# Patient Record
Sex: Female | Born: 1985 | Race: White | Hispanic: No | Marital: Single | Smoking: Never smoker
Health system: Southern US, Community
[De-identification: ages and names within clinical notes are randomized; demographics above are authoritative.]

## PROBLEM LIST (undated history)

## (undated) DIAGNOSIS — F419 Anxiety disorder, unspecified: Secondary | ICD-10-CM

## (undated) HISTORY — PX: INNER EAR SURGERY: SHX679

## (undated) HISTORY — DX: Anxiety disorder, unspecified: F41.9

---
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
## 2003-06-25 ENCOUNTER — Encounter: Admission: RE | Admit: 2003-06-25 | Discharge: 2003-06-25 | Payer: Self-pay | Admitting: Family Medicine

## 2003-08-11 ENCOUNTER — Other Ambulatory Visit: Admission: RE | Admit: 2003-08-11 | Discharge: 2003-08-11 | Payer: Self-pay | Admitting: Family Medicine

## 2006-01-30 ENCOUNTER — Other Ambulatory Visit: Admission: RE | Admit: 2006-01-30 | Discharge: 2006-01-30 | Payer: Self-pay | Admitting: Family Medicine

## 2007-07-05 ENCOUNTER — Emergency Department (HOSPITAL_COMMUNITY): Admission: EM | Admit: 2007-07-05 | Discharge: 2007-07-05 | Payer: Self-pay | Admitting: Family Medicine

## 2010-11-25 NOTE — H&P (Signed)
[place]. [facility]  Patient:    Ashley Hughes, Ashley Hughes Visit Number: [id number] MRN: [id number]          Service Type: DSU Location: [facility] Attending Physician:  Waldon Merl Dictated by:   Keturah Barre, M.D. Admit Date:  10/31/2001   CC:         Monica Becton, M.D.   History and Physical  HISTORY OF PRESENT ILLNESS:  This patient is an 25 year old female who has had multiple ear surgeries in the past.  She has had bilateral myringotomy and tubes, has had PE tube in the right ear placed which is now extruded.  She has had apparently four tympanoplasties - the most recent one included a TORP reconstruction which has extruded as seen in my office and I removed that from the external ear canal.  She now has a quite retracted tympanic membrane. I can see the head of the stapes, the incus appears to be absent and probably was removed, and the malleus is retracted.  She has a very retracted tympanic membrane and inferiorly there is somewhat of a bulging area that may be granulation tissue, it may be cholesteatoma, it could be just an area of granulation.  First, we will do on the right ear a myringotomy and tube to relieve the fluid.  On the left ear, we will do an exploratory tympanotomy, lysis of adhesions, and removal of cholesteatoma if found, and possible tympanoplasty.  Her past audiogram has been showing a left ear 40 decibel air-bone gap, on the right a 25 decibel with a 96% and 100% discrimination score and a flat tympanogram on both ears.  PAST HISTORY:  Is quite unremarkable.  She does take a birth control pill and does take one Paxil per day, and has been on Prolix D to try to bring more decongestant to her eustachian tube.  She has used Neo-Synephrine nasal drops to no effect.  SURGICAL HISTORY:  Her only history of surgery is the tympanoplasty and the tubes in the past.  PHYSICAL EXAMINATION:  VITAL SIGNS:  Blood pressure  140/80, pulse 95, weight 165, height 5 feet 1 inch.  HEENT:  Her right ear shows a considerable retraction of the tympanic membrane with some adhesions but just fluid seen behind the tympanic membrane which appears to be quite thin.  The left ear shows severe retraction with this area of the hypotympanum which is somewhat suspicious of granulation tissue or cholesteatoma.  We can see the epitympanum reasonably well which is quite retracted.  I can see the head of the stapes.  The retraction is down to the facial nerve region and to the promontory region, but then this suspicious bulge is inferiorly.  She has been on antibiotics and decongestants in the past on several occasions, making little progress.  The audiogram is as mentioned.  CHEST:  Clear, no rales, rhonchi, or wheezes.  CARDIOVASCULAR:  No opening snaps, murmurs, or gallops.  ABDOMEN:  Free of any organomegaly, tenderness, or mass.  EXTREMITIES:  Unremarkable.  NECK:  Free of any thyromegaly, cervical adenopathy, or mass.  NEUROLOGIC:  Her cranial nerves are intact - true cords, false cords, the shoulder strength, facial, EOMs are all within normal limits.  INITIAL DIAGNOSIS:  Status post serous otitis otitis media bilateral, history of PE tube right, history of tympanoplasty with TORP left, history of previous tympanoplasties.  PLAN:  Our plan is to do a right myringotomy and tube and a left exploratory tympanotomy  and lysis of adhesions and removal of cholesteatoma and/or tympanoplasty as necessary. Dictated by:   Keturah Barre, M.D. Attending Physician:  Waldon Merl DD:  10/31/01 TD:  10/31/01 Job: [id number]

## 2010-11-25 NOTE — Op Note (Signed)
[place]. [facility]  Patient:    Ashley Hughes, Ashley Hughes                MRN: [id number] Proc. Date: 07/05/00 Adm. Date:  [id number] Attending:  Claudina Lick                           Operative Report  PREOPERATIVE DIAGNOSES: 1. 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 2. Chronic right serous otitis.  POSTOPERATIVE DIAGNOSES: 1. 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 2. Chronic right serous otitis.  PROCEDURES: 1. Second stage left tympanoplasty with TORP. 2. Right tube myringotomy.  SURGEON: Dr. Garrison Columbus  ANESTHESIA:  General.  PROCEDURE:  This patient 6 months prior had undergone a left tympanomastoidectomy with removal of a middle ear and mastoid cholesteatoma and she is admitted at this time for the second stage procedure and ossicular reconstruction if possible.  The patient has also developed a chronic right serous otitis and has a bilateral conductive loss and is planned for a right tube myringotomy as well.  DESCRIPTION OF PROCEDURE:  After satisfactory general endotracheal anesthesia had been induced 2% Xylocaine containing 1:200,000 epinephrine was infiltrated in the left external ear canal and post auricularly for hemostasis after which the ear and surrounding area were prepped with Betadine and sterile drapes applied.  A large posterior canal skin flap was outlined with he scalpel, and the skin then elevated from the canal wall down to the fibrous annulus which was lifted from the bony sulcus. The posterior half of the ear drum was quite retracted and this was carefully dissected out from over the incus which had been placed in the posterior superior quadrant.  It was also adherent to the medial wall in the posterior inferior quadrant but there was  good air containing space anterior to that and a previously placed reinforced Silastic sheeting was removed.  The incus was dissected up from over the stapes area.  There was no evidence of middle ear or attic cholesteatoma.  There was some scarring in the attic that was dissected away. The incus was placed in the attic, and I could not fully visualize the stapedial area and the anterior crux of the stapes was absent.  The remainder of the superstructure was displaced inferiorly adherent to the prominent tori.  The foot plate was intact and mobile and there was still a partial attachment of the posterior crux.  This was simply left intact.  Thin nonreinforced middle ear Silastic sheeting was placed over the medial wall middle ear space and passed under the handle of the malleolus.  A small incision was made over the posterior external ear and a small piece of perichondrium as well as a small button of ear cartilage was taken and placed in saline.  The perichondrium was placed aside to dry and then an additional piece of cartilage was taken and after closing the incision with interrupted 5-0 chromic gut, the second piece of cartilage was placed in the attic region to seal that off.  A xomed hydroxy appetite TORP was then selected.  The shaft shortened by approximately 1/3rd. The shaft was then centered in the oval window and supportED with several small pledgets of Gelfoam saturated with Cortisporin suspension.  The button of cartilage was slightly crushed and placed over the  head of the prosthesis, and the perichondrial graft was placed over this and tucked under the margin of the drum inferiorly and under the handle of the malleus.  The squamous epithelium which had been dissected up from the posterior drum head area was then placed over the prosthesis and the canal skin was then replaced in near anatomic position.  Pledgets of Gelfoam saturatED with Cortisporin suspension were placed  over the graft and canal skin flap and the remainder of the canal filled with an oto wick saturated with Cortisporin suspension.  Examination of the right ear under the microscope showed a very tenuous connection between the head of the stapes and the long process of the incus and the ear drum was exceedingly thin and retracted and there was clear middle ear fluid.  A small incision was made just inferior to the umbo.  Some seromucoid fluid removed with a suction and a Donaldson silastic collar button tube was inserted through the opening and supported with a small pledget of Gelfoam saturated with Cortisporin suspension.  The patient tolerated the procedure well.  ESTIMATED BLOOD LOSS:  Less than 5 cc.  DISPOSITION: She was awakened from the anesthesia and taken to the recovery room in satisfactory condition. DD:  07/05/00 TD:  07/05/00 Job: [id number]

## 2010-11-25 NOTE — Op Note (Signed)
[place]. [facility]  Patient:    Ashley Hughes                 MRN: [id number] Proc. Date: 07/25/99 Adm. Date:  [id number] Attending:  Claudina Lick                           Operative Report  PREOPERATIVE DIAGNOSIS:  Chronic left otitis media with atelectasis posterior ear drum.  POSTOPERATIVE DIAGNOSIS:  Attical and mastoid middle ear cholesteatoma.  OPERATION PERFORMED:  Left tympanomastoidectomy.  SURGEON:  Robert L. Lyman Bishop, M.D.  ANESTHESIA:  General.  DESCRIPTION OF PROCEDURE:  This 25 year old white female was admitted with a history of recurrent ear infections in early childhood.  She had had two myringotomies and had not been seen in a number of years until two months ago. She was seen with drainage from her left ear and had a retracted ear drum with middle ear fluid and granulation tissue over the posterior aspect of the ear drum. This was treated with silver nitrate cauterization and Cortisporin drops.  Exam audiogram showed a mild conductive loss in the left ear, but because of the granulation tissue and continued drainage, I recommended exploration of possible cholesteatoma.  After satisfactory general endotracheal anesthesia had been induced, 2% Xylocaine with 1:50,000 epinephrine was infiltrated into the external ear canal on the left and postauricularly in the left ear and surrounding area.  Prepped with Betadine and sterile drapes were applied.  The granulation tissue was gradually removed rom the surface of the ear drum posteriorly, and I could see that there was a myringostapediopexy with the posterior half of the ear drum thick and adherent o the medial wall of the middle ear space.  The anterior portion of the ear drum as very thick, and I suspected that there was a middle ear space with fluid.  So after removal of the granulation tissue, I could see that there was an attical retraction pocket  cholesteatoma; the extent of which I could not determine at this point.  Vertical canal incisions were made at 12 and 6 oclock and connected posteriorly  just medial to the bony cartilaginous junction.  The very thick posterior skin as elevated from the bony canal down to the fibrous annulus, which was carefully lifted from the bony sulcus.  The drum was closely adherent to the posterior medial wall of the middle ear space.  An anterior atticotomy was carried out with a curette and a small drill, and it became obvious that the limits of the retraction pocket could not be determined or treated from this approach.  Therefore, a postauricular incision was made.  The soft tissue was elevated from over the mastoid cortex, which was saucerized with a large, round, cutting bur until the  antrum was entered, and a very thick wall cholesteatoma sac was found extending  into the mastoid.  This mastoid was small, relatively acellular, and a complete  mastoidectomy was carried out.  Then an anterior atticotomy was carried forward. The body and short process of the incus was found, and the remainder of the incus was removed and placed in saline.  I found the squamous epithelium extended medial and anterior to the head of the malleus.  However, from the posterior approach, I felt like I was able to completely remove it.  I then followed the squamous epithelium from the posterior atticotomy into the middle ear space, and the posterior half  of the ear drum was carefully dissected up from the medial wall.  The handle of the malleus was quite retracted and where the end of the eardrum anteriorly lead back to the medial wall of the middle ear space, this was incised creating almost a total defect of the eardrum.  The middle ear space was lined ith very thickened mucosa with thick mucoid exudate.  This was present inferiorly as well.  A large piece of temporalis fascia had been taken for use as a  graft and  placed aside to dry.  The cholesteatoma sac was completely removed from the mastoid attic, and the remaining mastoid cavity was smoothed with a diamond bur.  A circular piece of thin, reinforced, Silastic sheeting was placed over the medial wall of the middle ear space and allowed to extend around the stapes.  The stapes was fully encased in very thick, fibrous, inflammatory tissue.  The integrity of the stapes, I could not determine at this point in time.  The middle ear cleft as then packed with pledgets of Gelfoam and saturated with Cortisporin suspension, and the temporalis fascia graft was trimmed to appropriate size and shape and placed over the defect, tucked under the margins of the drum anteriorly and inferiorly, and extended under the handle of the malleus.  The incus, which had been cleaned, was replaced and placed in the posterior, superior, ______  of the middle ear space, placed over the head of the stapes, and then tucked under the neck of the malleus.  I did not plan at this time to do a complete incus rotation.  The graft was allowed to extend over this and slightly onto the posterior superior canal wall, and the canal skin flaps were replaced in anatomic position over the graft. The external canal was then packed with Gelfoam saturated with Cortisporin suspension with the lateral part of the canal filled with ______.  The postauricular incision was closed in layers with 3-0 and 4-0 chromic gut with running 5-0 nylon to the skin.  Estimated blood loss was 10-15 cc.  The patient  tolerated the procedure well and was awakened from anesthesia and taken to the recovery room in satisfactory condition.  It is anticipated that a second look r second-stage procedure will be necessary in six months. DD:  07/25/99 TD:  07/25/99 Job: [id number]

## 2010-11-25 NOTE — Op Note (Signed)
[place]. [facility]  Patient:    Ashley Hughes, Ashley Hughes Visit Number: [id number] MRN: [id number]          Service Type: DSU Location: [facility] Attending Physician:  Waldon Merl Dictated by:   Keturah Barre, M.D. Proc. Date: 10/31/01 Admit Date:  10/31/2001   CC:         Vernon Prey, M.D., [place], [place]   Operative Report  PREOPERATIVE DIAGNOSIS:  Right serous otitis with retraction of the tympanic membrane.  The left ear severe retraction of tympanic membrane with hypotympanic fullness, possible cholesteatoma, possible granulation tissue with severe retraction and poor eustachian tube function.  OPERATIONS:  Right myringotomy and tube, left exploratory tympanotomy, lysis of adhesive, removal of hypotympanic cholesteatoma, and mobilization of the ossicular or checking of the ossicular chain, which is mobile.  OPERATOR:  Keturah Barre, M.D.  ANESTHESIA:  General endotracheal.  DESCRIPTION OF PROCEDURE:  The patient was placed in the supine position. Under general endotracheal anesthesia, the right ear was first approached and a myringotomy was carried out in the anterior/inferior aspect of the tympanic membrane.  The tympanic membrane was found to be thin, fluid was suctioned, a type I Paparella PE tube was placed, followed by Pediotic drops.  Cotton was placed in the external ear canal.  The head was then turned and the left ear was approached.  This was prepped and draped in the usual manner using Betadine and the head drape, and then the scope was again brought in after rescrubbing, draping, and gowning and gloving.  The four quadrants of the external canal were injected with 1% Xylocaine with epinephrine.  All debris was removed from the external ear canal.  The tympanotomy incision was carried out and we extended down anteriorly was so retracted that we decided to approach this more from a hypotympanic way and we worked our way down to  the annulus and then into the middle ear.  In fact, with this bulging area, we in fact did find cholesteatoma.  We were able to remove considerable amount of cholesteatoma that was taking up this hypotympanum and we removed a considerable amount of debris just with suctioning and then using the cup forceps to the right, left, upbiting and very carefully removing these, and then the whirly bird was used to explore areas of hypotympanum that we could not see directly and further cholesteatoma was removed.  All of the cholesteatoma was removed.  The entire hypotympanum was found to be in quite decent condition after this was removed and the membrane appeared to be reasonable and I think, essentially, the eustachian tube has a chance to function quite reasonably.  The problem superiorly is that there was compartmentalization and adhesions and these were lysed.  The drum, which was somewhat thin, but not in a condition to try to repair it was retracted laterally and now with the cholesteatoma gone, I think the best way to approach this is to let the eustachian tube do what it might very well be able to do considering this girl is now 25 years old.  If we could get it to ventilate it well, then the retraction would no longer be a problem and we may come back later and do a further lateralization of the epitympanum region to improve hearing.  I think actually, the hearing will be improved just with this removal of cholesteatoma and correction of the adhesions around the inferior portion of the stapes.  We did not correct all  of the adhesions up superior to the stapes because it was difficult to see around the facial nerve and the facial nerve region.  Once this was achieved, then the tympanic membrane, which was still intact, was replaced in its original position and Gelfoam was placed within the middle ear.  Neosporin was placed in the external ear canal and the patient was awakened, tolerated  the procedure well, and was doing well postoperatively.  FOLLOW-UP:  One week, and then in two weeks, three weeks, six weeks, eight weeks and then three months, six months, and a year. Dictated by:   Keturah Barre, M.D. Attending Physician:  Waldon Merl DD:  10/31/01 TD:  10/31/01 Job: [id number]

## 2016-08-18 ENCOUNTER — Ambulatory Visit (INDEPENDENT_AMBULATORY_CARE_PROVIDER_SITE_OTHER): Payer: BLUE CROSS/BLUE SHIELD | Admitting: Physician Assistant

## 2016-08-18 ENCOUNTER — Encounter: Payer: Self-pay | Admitting: Physician Assistant

## 2016-08-18 ENCOUNTER — Ambulatory Visit: Payer: Self-pay | Admitting: Physician Assistant

## 2016-08-18 VITALS — BP 105/60 | HR 71 | Temp 97.3°F | Ht 60.0 in | Wt 239.0 lb

## 2016-08-18 DIAGNOSIS — N912 Amenorrhea, unspecified: Secondary | ICD-10-CM | POA: Diagnosis not present

## 2016-08-18 DIAGNOSIS — R110 Nausea: Secondary | ICD-10-CM | POA: Diagnosis not present

## 2016-08-18 NOTE — Patient Instructions (Signed)

## 2016-08-19 LAB — BETA HCG QUANT (REF LAB): hCG Quant: <1 m[IU]/mL

## 2016-08-21 ENCOUNTER — Encounter: Payer: Self-pay | Admitting: Physician Assistant

## 2016-08-21 ENCOUNTER — Other Ambulatory Visit: Payer: Self-pay | Admitting: *Deleted

## 2016-08-21 DIAGNOSIS — N926 Irregular menstruation, unspecified: Secondary | ICD-10-CM

## 2016-08-21 NOTE — Addendum Note (Signed)
Addended by: Tamera PuntWRAY, Marijose Curington S on: 08/21/2016 10:19 AM   Modules accepted: Orders

## 2016-08-21 NOTE — Progress Notes (Signed)
BP 105/60   Pulse 71   Temp 97.3 F (36.3 C) (Oral)   Ht 5' (1.524 m)   Wt 239 lb (108.4 kg)   BMI 46.68 kg/m    Subjective:    Patient ID: Ashley Hughes, female    DOB: 10/02/1985, 31 y.o.   MRN: [id number]  Ashley Hughes is a 31 y.o. female presenting on 08/18/2016 for Establish Care (Thinks she may be pregnant. Has a mirena)  HPI this is a new patient to our office. She has been living in [place] for the past 2 years with her husband. She had lived in the area previously. She has just moved back. She has had a Mirena in place for about 2 years now. He was placed while living in [place]. To begin with she had rare spotting and essentially no cycles. She still is not having a cycle. She just feels very tired, some nausea. She is concerned about being pregnant. We have discussed referral to a gynecologist if her test is positive. She had discussed wanting to go to [facility] in [place]. 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 Her history is positive for 2 pregnancies, one voluntary abortion without complications, one pregnancy without complications.  No past medical history on file. Relevant past medical, surgical, family and social history reviewed and updated as indicated. Interim medical history since our last visit reviewed. Allergies and medications reviewed and updated.   Data reviewed from any sources in EPIC.  Review of Systems  Constitutional: Positive for fatigue. Negative for activity change and fever.  HENT: Negative.   Eyes: Negative.   Respiratory: Negative.  Negative for cough.   Cardiovascular: Negative.  Negative for chest pain.  Gastrointestinal: Negative.  Negative for abdominal pain.  Endocrine: Negative.   Genitourinary: Negative.  Negative for dysuria.  Musculoskeletal: Negative.   Skin: Negative.   Neurological: Negative.      Social History   Social History  . Marital status: Single    Spouse name: N/A  . Number of  children: N/A  . Years of education: N/A   Occupational History  . Not on file.   Social History Main Topics  . Smoking status: Never Smoker  . Smokeless tobacco: Never Used  . Alcohol use Not on file  . Drug use: Unknown  . Sexual activity: Not on file   Other Topics Concern  . Not on file   Social History Narrative  . No narrative on file    Past Surgical History:  Procedure Laterality Date  . CESAREAN SECTION  2015  . INNER EAR SURGERY      Family History  Problem Relation Age of Onset  . Diabetes Mother     Allergies as of 08/18/2016   No Known Allergies     Medication List       Accurate as of 08/18/16 11:59 PM. Always use your most recent med list.          DULoxetine 60 MG capsule Commonly known as:  CYMBALTA Take 60 mg by mouth daily.   levonorgestrel 20 MCG/24HR IUD Commonly known as:  MIRENA 1 each by Intrauterine route once.          Objective:    BP 105/60   Pulse 71   Temp 97.3 F (36.3 C) (Oral)   Ht 5' (1.524 m)   Wt 239 lb (108.4 kg)   BMI 46.68 kg/m   No Known Allergies Wt Readings from Last  3 Encounters:  08/18/16 239 lb (108.4 kg)    Physical Exam  Constitutional: She is oriented to person, place, and time. She appears well-developed and well-nourished.  HENT:  Head: Normocephalic and atraumatic.  Right Ear: Tympanic membrane, external ear and ear canal normal.  Left Ear: Tympanic membrane, external ear and ear canal normal.  Nose: Nose normal. No rhinorrhea.  Mouth/Throat: Oropharynx is clear and moist and mucous membranes are normal. No oropharyngeal exudate or posterior oropharyngeal erythema.  Eyes: Conjunctivae and EOM are normal. Pupils are equal, round, and reactive to light.  Neck: Normal range of motion. Neck supple.  Cardiovascular: Normal rate, regular rhythm, normal heart sounds and intact distal pulses.   Pulmonary/Chest: Effort normal and breath sounds normal.  Abdominal: Soft. Bowel sounds are normal.    Neurological: She is alert and oriented to person, place, and time. She has normal reflexes.  Skin: Skin is warm and dry. No rash noted.  Psychiatric: She has a normal mood and affect. Her behavior is normal. Judgment and thought content normal.    Results for orders placed or performed in visit on 08/18/16  Beta HCG, Quant  Result Value Ref Range   hCG Quant <1 mIU/mL      Assessment & Plan:   1. Amenorrhea GYN referral if positive pregnancy - Beta HCG, Quant  2. Nausea GYN referral if positive pregnancy - Beta HCG, Quant   Continue all other maintenance medications as listed above. Educational handout given for prenatal care  Follow up plan: Return if symptoms worsen or fail to improve.  Remus Loffler PA-C [facility] [address]  [place], [place] [address] [phone]   08/21/2016, 8:23 AM

## 2016-08-24 ENCOUNTER — Encounter: Payer: Self-pay | Admitting: Adult Health

## 2016-08-24 ENCOUNTER — Ambulatory Visit (INDEPENDENT_AMBULATORY_CARE_PROVIDER_SITE_OTHER): Payer: BLUE CROSS/BLUE SHIELD | Admitting: Adult Health

## 2016-08-24 VITALS — BP 130/60 | HR 86 | Ht 60.75 in | Wt 242.0 lb

## 2016-08-24 DIAGNOSIS — Z3202 Encounter for pregnancy test, result negative: Secondary | ICD-10-CM

## 2016-08-24 DIAGNOSIS — Z975 Presence of (intrauterine) contraceptive device: Secondary | ICD-10-CM

## 2016-08-24 DIAGNOSIS — R109 Unspecified abdominal pain: Secondary | ICD-10-CM | POA: Insufficient documentation

## 2016-08-24 DIAGNOSIS — L900 Lichen sclerosus et atrophicus: Secondary | ICD-10-CM | POA: Diagnosis not present

## 2016-08-24 DIAGNOSIS — T8332XA Displacement of intrauterine contraceptive device, initial encounter: Secondary | ICD-10-CM

## 2016-08-24 LAB — POCT URINALYSIS DIPSTICK
Glucose, UA: NEGATIVE
Ketones, UA: NEGATIVE
Leukocytes, UA: NEGATIVE
Nitrite, UA: NEGATIVE
Protein, UA: NEGATIVE

## 2016-08-24 LAB — POCT URINE PREGNANCY: Preg Test, Ur: NEGATIVE

## 2016-08-24 MED ORDER — CLOBETASOL PROPIONATE 0.05 % EX CREA: 1.0000 "application " | TOPICAL_CREAM | Freq: Two times a day (BID) | CUTANEOUS | 1 refills | Status: DC

## 2016-08-24 NOTE — Progress Notes (Signed)
Subjective:     Patient ID: Ashley Hughes, female   DOB: 07/25/1985, 31 y.o.   MRN: [id number]  HPI Ashley Hughes is a 31 year old white female, married in as new pt., complaining of stomach cramps and pressure and feels movement.Has IUD for 2.5 years was placed in [place] and does not feel strings.Has had negative blood and urine pregnancy tests.  PCP is [facility].   Review of Systems +stomach cramps and pressure Feels movement Reviewed past medical,surgical, social and family history. Reviewed medications and allergies.     Objective:   Physical Exam BP 130/60 (BP Location: Left Arm, Patient Position: Sitting, Cuff Size: Large)   Pulse 86   Ht 5' 0.75" (1.543 m)   Wt 242 lb (109.8 kg)   BMI 46.10 kg/m UPT negative.Urine dipstick trace blood. PHQ 2 score 1. Skin warm and dry.Pelvic: external genitalia is normal in appearance but has thick thickened skin left inner labia, vagina: pink with good moisture and rugae,urethra has no lesions or masses noted, cervix:smooth, NO IUD strings seen, uterus: normal size, shape and contour, non tender, no masses felt, adnexa: no masses or tenderness noted. Bladder is non tender and no masses felt. Discussed lichen sclerosus with her and showed pictures and that it is chronic, and may need biopsy if does not respond to temovate.  Will get US tomorrow to assess uterus and IUD.  Face time 20 minutes with 50% counseling and coordinating care.     Assessment:     1. Stomach cramps   2. Intrauterine contraceptive device threads lost, initial encounter   3. Pregnancy examination or test, negative result   4. Lichen sclerosus       Plan:     Return in 1 day for US to assess IUD and stomach cramps Rx temovate 0.05% cream use bid for 2 weeks then 2-3 x weekly to affected area Review handout on lichen sclerosus Follow up with me in 4 weeks

## 2016-08-24 NOTE — Patient Instructions (Signed)
Lichen Sclerosus Introduction Lichen sclerosus is a skin problem. It can happen on any part of the body, but it commonly involves the anal or genital areas. It can cause itching and discomfort in these areas. Treatment can help to control symptoms. When the genital area is affected, getting treatment is important because the condition can cause scarring that may lead to other problems. What are the causes? The cause of this condition is not known. It could be the result of an overactive immune system or a lack of certain hormones. Lichen sclerosus is not an infection or a fungus. It is not passed from one person to another (not contagious). What increases the risk? This condition is more likely to develop in women, usually after menopause. What are the signs or symptoms? Symptoms of this condition include:  Thin, wrinkled, white areas on the skin.  Thickened white areas on the skin.  Red and swollen patches (lesions) on the skin.  Tears or cracks in the skin.  Bruising.  Blood blisters.  Severe itching. You may also have pain, itching, or burning with urination. Constipation is also common in people with lichen sclerosus. How is this diagnosed? This condition may be diagnosed with a physical exam. In some cases, a tissue sample (biopsy sample) may be removed to be looked at under a microscope. How is this treated? This condition is usually treated with medicated creams or ointments (topical steroids) that are applied over the affected areas. Follow these instructions at home:  Take over-the-counter and prescription medicines only as told by your health care provider.  Use creams or ointments as told by your health care provider.  Do not scratch the affected areas of skin.  Women should keep the vaginal area as clean and dry as possible.  Keep all follow-up visits as told by your health care provider. This is important. Contact a health care provider if:  You have increasing  redness, swelling, or pain in the affected area.  You have fluid, blood, or pus coming from the affected area.  You have new lesions on your skin.  You have a fever.  You have pain during sex. This information is not intended to replace advice given to you by your health care provider. Make sure you discuss any questions you have with your health care provider. Document Released: 11/16/2010 Document Revised: 12/02/2015 Document Reviewed: 09/21/2014  2017 Elsevier  

## 2016-08-25 ENCOUNTER — Ambulatory Visit (INDEPENDENT_AMBULATORY_CARE_PROVIDER_SITE_OTHER): Payer: BLUE CROSS/BLUE SHIELD

## 2016-08-25 ENCOUNTER — Telehealth: Payer: Self-pay | Admitting: Adult Health

## 2016-08-25 ENCOUNTER — Other Ambulatory Visit: Payer: Self-pay | Admitting: Adult Health

## 2016-08-25 DIAGNOSIS — R109 Unspecified abdominal pain: Secondary | ICD-10-CM

## 2016-08-25 DIAGNOSIS — N854 Malposition of uterus: Secondary | ICD-10-CM

## 2016-08-25 DIAGNOSIS — Z975 Presence of (intrauterine) contraceptive device: Secondary | ICD-10-CM | POA: Diagnosis not present

## 2016-08-25 DIAGNOSIS — N83292 Other ovarian cyst, left side: Secondary | ICD-10-CM

## 2016-08-25 DIAGNOSIS — T8332XA Displacement of intrauterine contraceptive device, initial encounter: Secondary | ICD-10-CM

## 2016-08-25 NOTE — Progress Notes (Signed)
PELVIC US TA/TV: homogeneous anteverted uterus,wnl,IUD is centrally located with in the endometrium,EEC 9.6 mm,simple left ovarian cyst 2.5 x 2.4 x 2.2 cm,normal right ovary,no free fluid,no pain during ultrasound,ovaries appear mobile

## 2016-08-25 NOTE — Telephone Encounter (Signed)
Pt aware IUD in place, in good position and has simple cyst left ovary, increase fluids and use tylenol or advil

## 2016-09-21 ENCOUNTER — Ambulatory Visit: Payer: BLUE CROSS/BLUE SHIELD | Admitting: Adult Health

## 2016-09-26 ENCOUNTER — Ambulatory Visit: Payer: BLUE CROSS/BLUE SHIELD | Admitting: Adult Health

## 2016-10-04 ENCOUNTER — Ambulatory Visit: Payer: BLUE CROSS/BLUE SHIELD | Admitting: Adult Health

## 2016-10-24 ENCOUNTER — Ambulatory Visit (INDEPENDENT_AMBULATORY_CARE_PROVIDER_SITE_OTHER): Payer: BLUE CROSS/BLUE SHIELD | Admitting: *Deleted

## 2016-10-24 DIAGNOSIS — Z23 Encounter for immunization: Secondary | ICD-10-CM

## 2016-10-24 DIAGNOSIS — Z111 Encounter for screening for respiratory tuberculosis: Secondary | ICD-10-CM | POA: Diagnosis not present

## 2016-10-24 NOTE — Progress Notes (Signed)
PPD placed R forearm Pt tolerated well 

## 2016-10-26 LAB — TB SKIN TEST
Induration: 0 mm
TB Skin Test: NEGATIVE

## 2016-11-08 ENCOUNTER — Encounter: Payer: Self-pay | Admitting: Physician Assistant

## 2016-11-08 ENCOUNTER — Ambulatory Visit (INDEPENDENT_AMBULATORY_CARE_PROVIDER_SITE_OTHER): Payer: BLUE CROSS/BLUE SHIELD | Admitting: Physician Assistant

## 2016-11-08 VITALS — BP 112/66 | HR 66 | Temp 98.1°F | Ht 60.75 in | Wt 237.8 lb

## 2016-11-08 DIAGNOSIS — J301 Allergic rhinitis due to pollen: Secondary | ICD-10-CM

## 2016-11-08 MED ORDER — LORATADINE 10 MG PO TABS: 10.0000 mg | ORAL_TABLET | Freq: Every day | ORAL | 11 refills | Status: DC

## 2016-11-08 MED ORDER — FLUTICASONE PROPIONATE 50 MCG/ACT NA SUSP: 2.0000 | Freq: Every day | NASAL | 6 refills | Status: DC

## 2016-11-08 MED ORDER — TRAZODONE HCL 50 MG PO TABS: 50.0000 mg | ORAL_TABLET | Freq: Every evening | ORAL | 6 refills | Status: DC | PRN

## 2016-11-08 NOTE — Patient Instructions (Signed)
Insomnia Insomnia Insomnia is a sleep disorder that makes it difficult to fall asleep or to stay asleep. Insomnia can cause tiredness (fatigue), low energy, difficulty concentrating, mood swings, and poor performance at work or school. There are three different ways to classify insomnia:  Difficulty falling asleep.  Difficulty staying asleep.  Waking up too early in the morning. Any type of insomnia can be long-term (chronic) or short-term (acute). Both are common. Short-term insomnia usually lasts for three months or less. Chronic insomnia occurs at least three times a week for longer than three months. What are the causes? Insomnia may be caused by another condition, situation, or substance, such as:  Anxiety.  Certain medicines.  Gastroesophageal reflux disease (GERD) or other gastrointestinal conditions.  Asthma or other breathing conditions.  Restless legs syndrome, sleep apnea, or other sleep disorders.  Chronic pain.  Menopause. This may include hot flashes.  Stroke.  Abuse of alcohol, tobacco, or illegal drugs.  Depression.  Caffeine.  Neurological disorders, such as Alzheimer disease.  An overactive thyroid (hyperthyroidism). The cause of insomnia may not be known. What increases the risk? Risk factors for insomnia include:  Gender. Women are more commonly affected than men.  Age. Insomnia is more common as you get older.  Stress. This may involve your professional or personal life.  Income. Insomnia is more common in people with lower income.  Lack of exercise.  Irregular work schedule or night shifts.  Traveling between different time zones. What are the signs or symptoms? If you have insomnia, trouble falling asleep or trouble staying asleep is the main symptom. This may lead to other symptoms, such as:  Feeling fatigued.  Feeling nervous about going to sleep.  Not feeling rested in the morning.  Having trouble concentrating.  Feeling  irritable, anxious, or depressed. How is this treated? Treatment for insomnia depends on the cause. If your insomnia is caused by an underlying condition, treatment will focus on addressing the condition. Treatment may also include:  Medicines to help you sleep.  Counseling or therapy.  Lifestyle adjustments. Follow these instructions at home:  Take medicines only as directed by your health care provider.  Keep regular sleeping and waking hours. Avoid naps.  Keep a sleep diary to help you and your health care provider figure out what could be causing your insomnia. Include:  When you sleep.  When you wake up during the night.  How well you sleep.  How rested you feel the next day.  Any side effects of medicines you are taking.  What you eat and drink.  Make your bedroom a comfortable place where it is easy to fall asleep:  Put up shades or special blackout curtains to block light from outside.  Use a white noise machine to block noise.  Keep the temperature cool.  Exercise regularly as directed by your health care provider. Avoid exercising right before bedtime.  Use relaxation techniques to manage stress. Ask your health care provider to suggest some techniques that may work well for you. These may include:  Breathing exercises.  Routines to release muscle tension.  Visualizing peaceful scenes.  Cut back on alcohol, caffeinated beverages, and cigarettes, especially close to bedtime. These can disrupt your sleep.  Do not overeat or eat spicy foods right before bedtime. This can lead to digestive discomfort that can make it hard for you to sleep.  Limit screen use before bedtime. This includes:  Watching TV.  Using your smartphone, tablet, and computer.  Stick to  a routine. This can help you fall asleep faster. Try to do a quiet activity, brush your teeth, and go to bed at the same time each night.  Get out of bed if you are still awake after 15 minutes of  trying to sleep. Keep the lights down, but try reading or doing a quiet activity. When you feel sleepy, go back to bed.  Make sure that you drive carefully. Avoid driving if you feel very sleepy.  Keep all follow-up appointments as directed by your health care provider. This is important. Contact a health care provider if:  You are tired throughout the day or have trouble in your daily routine due to sleepiness.  You continue to have sleep problems or your sleep problems get worse. Get help right away if:  You have serious thoughts about hurting yourself or someone else. This information is not intended to replace advice given to you by your health care provider. Make sure you discuss any questions you have with your health care provider. Document Released: 06/23/2000 Document Revised: 11/26/2015 Document Reviewed: 03/27/2014 Elsevier Interactive Patient Education  2017 [employer].

## 2016-11-10 DIAGNOSIS — J301 Allergic rhinitis due to pollen: Secondary | ICD-10-CM | POA: Insufficient documentation

## 2016-11-10 NOTE — Progress Notes (Signed)
BP 112/66   Pulse 66   Temp 98.1 F (36.7 C) (Oral)   Ht 5' 0.75" (1.543 m)   Wt 237 lb 12.8 oz (107.9 kg)   BMI 45.30 kg/m    Subjective:    Patient ID: Ashley Hughes, female    DOB: 1986-07-07, 31 y.o.   MRN: [id number]  HPI: TYLEAH LOH is a 31 y.o. female presenting on 11/08/2016 for Fatigue; sneezing; Sore Throat; Dizziness; Anxiety; and Hot Flashes  For a couple of weeks she has had increasing post nasal drip, itching and congestion. Denies fever or chills. Some watery eyes and tickly cough, especially at night.  Relevant past medical, surgical, family and social history reviewed and updated as indicated. Allergies and medications reviewed and updated.  Past Medical History:  Diagnosis Date  . Anxiety     Past Surgical History:  Procedure Laterality Date  . CESAREAN SECTION  2015  . INNER EAR SURGERY      Review of Systems  Constitutional: Negative.   HENT: Positive for congestion, postnasal drip, rhinorrhea and sneezing.   Eyes: Negative.   Respiratory: Positive for cough.   Gastrointestinal: Negative.   Genitourinary: Negative.     Allergies as of 11/08/2016   No Known Allergies     Medication List       Accurate as of 11/08/16 11:59 PM. Always use your most recent med list.          acetaminophen 325 MG tablet Commonly known as:  TYLENOL Take 650 mg by mouth as needed.   clobetasol cream 0.05 % Commonly known as:  TEMOVATE Apply 1 application topically 2 (two) times daily.   DULoxetine 60 MG capsule Commonly known as:  CYMBALTA Take 60 mg by mouth daily.   fluticasone 50 MCG/ACT nasal spray Commonly known as:  FLONASE Place 2 sprays into both nostrils daily.   ibuprofen 200 MG tablet Commonly known as:  ADVIL,MOTRIN Take 400 mg by mouth as needed.   levonorgestrel 20 MCG/24HR IUD Commonly known as:  MIRENA 1 each by Intrauterine route once.   loratadine 10 MG tablet Commonly known as:  CLARITIN Take 1 tablet (10 mg total)  by mouth daily.   traZODone 50 MG tablet Commonly known as:  DESYREL Take 1-2 tablets (50-100 mg total) by mouth at bedtime as needed for sleep.          Objective:    BP 112/66   Pulse 66   Temp 98.1 F (36.7 C) (Oral)   Ht 5' 0.75" (1.543 m)   Wt 237 lb 12.8 oz (107.9 kg)   BMI 45.30 kg/m   No Known Allergies  Physical Exam  Constitutional: She is oriented to person, place, and time. She appears well-developed and well-nourished.  HENT:  Head: Normocephalic and atraumatic.  Right Ear: A middle ear effusion is present.  Left Ear: A middle ear effusion is present.  Nose: Mucosal edema present. Right sinus exhibits no frontal sinus tenderness. Left sinus exhibits no frontal sinus tenderness.  Mouth/Throat: Posterior oropharyngeal erythema present. No oropharyngeal exudate or tonsillar abscesses.  Eyes: Conjunctivae and EOM are normal. Pupils are equal, round, and reactive to light.  Neck: Normal range of motion.  Cardiovascular: Normal rate, regular rhythm, normal heart sounds and intact distal pulses.   Pulmonary/Chest: Effort normal and breath sounds normal.  Abdominal: Soft. Bowel sounds are normal.  Neurological: She is alert and oriented to person, place, and time. She has normal reflexes.  Skin: Skin is  warm and dry. No rash noted.  Psychiatric: She has a normal mood and affect. Her behavior is normal. Judgment and thought content normal.  Nursing note and vitals reviewed.       Assessment & Plan:   1. Non-seasonal allergic rhinitis due to pollen - loratadine (CLARITIN) 10 MG tablet; Take 1 tablet (10 mg total) by mouth daily.  Dispense: 30 tablet; Refill: 11 - fluticasone (FLONASE) 50 MCG/ACT nasal spray; Place 2 sprays into both nostrils daily.  Dispense: 16 g; Refill: 6   Continue all other maintenance medications as listed above.  Follow up plan: Return if symptoms worsen or fail to improve.  Educational handout given for allergic rhinitis  Remus LofflerAngel S.  Nonnie Pickney PA-C [facility] [address]  [place], [place] [id number] [phone]   11/10/2016, 1:15 PM

## 2016-11-29 ENCOUNTER — Encounter: Payer: Self-pay | Admitting: Family Medicine

## 2016-11-29 ENCOUNTER — Ambulatory Visit (INDEPENDENT_AMBULATORY_CARE_PROVIDER_SITE_OTHER): Payer: BLUE CROSS/BLUE SHIELD | Admitting: Family Medicine

## 2016-11-29 VITALS — BP 122/60 | HR 71 | Temp 97.3°F | Ht 60.75 in | Wt 243.0 lb

## 2016-11-29 DIAGNOSIS — M25511 Pain in right shoulder: Secondary | ICD-10-CM

## 2016-11-29 MED ORDER — DICLOFENAC SODIUM 75 MG PO TBEC: 75.0000 mg | DELAYED_RELEASE_TABLET | Freq: Two times a day (BID) | ORAL | 2 refills | Status: DC

## 2016-11-29 NOTE — Patient Instructions (Signed)
Wear sling and swathe when active. Take it off several times a day for range of motion exercises including wind mills, etc.  Apply ice frequently 10-15 minutes at a time 4-6 times daily. Slowly increase use as needed. Use the diclofenac twice daily with food.

## 2016-11-29 NOTE — Progress Notes (Signed)
Subjective:  Patient ID: Ashley Hughes, female    DOB: 12/08/1985  Age: 31 y.o. MRN: [id number]  CC: Shoulder Pain (pt here today c/o right shoulder pain x 4 days. No recent injury or past injury to the shoulder.)   HPI Ashley Hughes Hughes for A new problem. Onset May 19 in the evening of a dull ache without of trauma at the superior margin of the right shoulder over the deltoid above the glenohumeral and aside the before meals joints. She states that she had been carrying her 33-year-old quite a bit [place]. She had no known injury. She also states that she does a lot of motion with the shoulder for sweeping mopping etc. 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 The pain increased the next couple of days. She tried to work but could not. Pain was even worse this morning. Pain now about 8/10. Worse with movement.  History Ashley Hughes has a past medical history of Anxiety.   She has a past surgical history that includes Inner ear surgery and Cesarean section (2015).   Her family history includes Cancer in her maternal grandfather and maternal grandmother; Diabetes in her mother; Healthy in her brother and brother; Hypertension in her paternal grandfather.She reports that she has never smoked. She has never used smokeless tobacco. She reports that she does not drink alcohol or use drugs.  Current Outpatient Prescriptions on File Prior to Visit  Medication Sig Dispense Refill  . acetaminophen (TYLENOL) 325 MG tablet Take 650 mg by mouth as needed.    . DULoxetine (CYMBALTA) 60 MG capsule Take 60 mg by mouth daily.    . fluticasone (FLONASE) 50 MCG/ACT nasal spray Place 2 sprays into both nostrils daily. 16 g 6  . ibuprofen (ADVIL,MOTRIN) 200 MG tablet Take 400 mg by mouth as needed.    Marland Kitchen. levonorgestrel (MIRENA) 20 MCG/24HR IUD 1 each by Intrauterine route once.    . loratadine (CLARITIN) 10 MG tablet Take 1 tablet (10 mg total) by mouth daily. 30 tablet 11  . traZODone  (DESYREL) 50 MG tablet Take 1-2 tablets (50-100 mg total) by mouth at bedtime as needed for sleep. 60 tablet 6   No current facility-administered medications on file prior to visit.     ROS Review of Systems  Constitutional: Positive for activity change. Negative for appetite change and fever.  Respiratory: Negative.  Negative for cough, chest tightness and shortness of breath.   Cardiovascular: Negative.  Negative for chest pain.  Musculoskeletal: Positive for arthralgias, back pain (chronic pain she states related to her obesity), joint swelling (Hands swell intermittently) and myalgias.    Objective:  BP 122/60   Pulse 71   Temp 97.3 F (36.3 C) (Oral)   Ht 5' 0.75" (1.543 m)   Wt 243 lb (110.2 kg)   BMI 46.29 kg/m   Physical Exam  Constitutional: She is oriented to person, place, and time. She appears well-developed and well-nourished.  HENT:  Head: Normocephalic.  Cardiovascular: Normal rate and regular rhythm.   No murmur heard. Pulmonary/Chest: Effort normal and breath sounds normal.  Musculoskeletal: Normal range of motion. She exhibits tenderness. She exhibits no edema or deformity.  The patient has full range of motion at the right shoulder. However there is marked tenderness for resisted abduction and abduction. There is point tenderness at the joint line above the glenohumeral area. Also over the acromioclavicular region for rotation. The right upper extremity is neurovascularly intact. Light touch,  pulses and range of motion intact  Neurological: She is alert and oriented to person, place, and time. She has normal reflexes. She exhibits normal muscle tone. Coordination normal.  Skin: Skin is warm and dry.  Psychiatric: She has a normal mood and affect. Her behavior is normal.    Assessment & Plan:   Dianna was seen today for shoulder pain.  Diagnoses and all orders for this visit:  Acute pain of right shoulder  Other orders -     diclofenac (VOLTAREN) 75  MG EC tablet; Take 1 tablet (75 mg total) by mouth 2 (two) times daily. For muscle and  Joint pain   I have discontinued Ms. Machamer's clobetasol cream. I am also having her start on diclofenac. Additionally, I am having her maintain her DULoxetine, levonorgestrel, acetaminophen, ibuprofen, loratadine, fluticasone, and traZODone.  Meds ordered this encounter  Medications  . diclofenac (VOLTAREN) 75 MG EC tablet    Sig: Take 1 tablet (75 mg total) by mouth 2 (two) times daily. For muscle and  Joint pain    Dispense:  60 tablet    Refill:  2     Follow-up: Return in about 2 weeks (around 12/13/2016), or if symptoms worsen or fail to improve, for shoulder, with [facility].  Mechele Claude, M.D.

## 2016-12-05 ENCOUNTER — Encounter: Payer: Self-pay | Admitting: *Deleted

## 2017-01-31 ENCOUNTER — Telehealth: Payer: Self-pay | Admitting: Physician Assistant

## 2017-01-31 MED ORDER — DULOXETINE HCL 60 MG PO CPEP: 60.0000 mg | ORAL_CAPSULE | Freq: Every day | ORAL | 5 refills | Status: DC

## 2017-01-31 NOTE — Telephone Encounter (Signed)
Detailed message left for patient that rx was sent to pharmacy and that she needs to be seen.

## 2017-01-31 NOTE — Telephone Encounter (Signed)
What is the name of the medication? Generic cymbalta 60 mg  Have you contacted your pharmacy to request a refill? No. This is a new rx from [facility]  Which pharmacy would you like this sent to? [facility] in [place].   Patient notified that their request is being sent to the clinical staff for review and that they should receive a call once it is complete. If they do not receive a call within 24 hours they can check with their pharmacy or our office.

## 2017-01-31 NOTE — Telephone Encounter (Signed)
Medication sent for 6 months, needs check up appointment

## 2017-06-05 ENCOUNTER — Ambulatory Visit: Payer: BLUE CROSS/BLUE SHIELD | Admitting: Physician Assistant

## 2017-06-12 ENCOUNTER — Encounter: Payer: Self-pay | Admitting: Physician Assistant

## 2017-06-12 ENCOUNTER — Ambulatory Visit (INDEPENDENT_AMBULATORY_CARE_PROVIDER_SITE_OTHER): Payer: BLUE CROSS/BLUE SHIELD | Admitting: Physician Assistant

## 2017-06-12 VITALS — BP 119/73 | HR 66 | Temp 97.7°F | Ht 61.0 in | Wt 234.0 lb

## 2017-06-12 DIAGNOSIS — R202 Paresthesia of skin: Secondary | ICD-10-CM

## 2017-06-12 DIAGNOSIS — M79641 Pain in right hand: Secondary | ICD-10-CM | POA: Insufficient documentation

## 2017-06-12 DIAGNOSIS — F39 Unspecified mood [affective] disorder: Secondary | ICD-10-CM | POA: Diagnosis not present

## 2017-06-12 DIAGNOSIS — G629 Polyneuropathy, unspecified: Secondary | ICD-10-CM | POA: Diagnosis not present

## 2017-06-12 DIAGNOSIS — R200 Anesthesia of skin: Secondary | ICD-10-CM | POA: Diagnosis not present

## 2017-06-12 DIAGNOSIS — M79642 Pain in left hand: Secondary | ICD-10-CM | POA: Diagnosis not present

## 2017-06-12 DIAGNOSIS — R519 Headache, unspecified: Secondary | ICD-10-CM | POA: Insufficient documentation

## 2017-06-12 DIAGNOSIS — R51 Headache: Secondary | ICD-10-CM

## 2017-06-12 MED ORDER — ARIPIPRAZOLE 2 MG PO TABS: 2.0000 mg | ORAL_TABLET | Freq: Every day | ORAL | 5 refills | Status: DC

## 2017-06-12 MED ORDER — HYDROCHLOROTHIAZIDE 25 MG PO TABS: 25.0000 mg | ORAL_TABLET | Freq: Every day | ORAL | 3 refills | Status: DC

## 2017-06-12 MED ORDER — DICLOFENAC SODIUM 75 MG PO TBEC: 75.0000 mg | DELAYED_RELEASE_TABLET | Freq: Two times a day (BID) | ORAL | 5 refills | Status: DC

## 2017-06-12 NOTE — Patient Instructions (Signed)
Carpal Tunnel Syndrome Carpal tunnel syndrome is a condition that causes pain in your hand and arm. The carpal tunnel is a narrow area that is on the palm side of your wrist. Repeated wrist motion or certain diseases may cause swelling in the tunnel. This swelling can pinch the main nerve in the wrist (median nerve). Follow these instructions at home: If you have a splint:  Wear it as told by your doctor. Remove it only as told by your doctor.  Loosen the splint if your fingers: ? Become numb and tingle. ? Turn blue and cold.  Keep the splint clean and dry. General instructions  Take over-the-counter and prescription medicines only as told by your doctor.  Rest your wrist from any activity that may be causing your pain. If needed, talk to your employer about changes that can be made in your work, such as getting a wrist pad to use while typing.  If directed, apply ice to the painful area: ? Put ice in a plastic bag. ? Place a towel between your skin and the bag. ? Leave the ice on for 20 minutes, 2-3 times per day.  Keep all follow-up visits as told by your doctor. This is important.  Do any exercises as told by your doctor, physical therapist, or occupational therapist. Contact a doctor if:  You have new symptoms.  Medicine does not help your pain.  Your symptoms get worse. This information is not intended to replace advice given to you by your health care provider. Make sure you discuss any questions you have with your health care provider. Document Released: 06/15/2011 Document Revised: 12/02/2015 Document Reviewed: 11/11/2014 Elsevier Interactive Patient Education  2018 [employer]  

## 2017-06-13 DIAGNOSIS — F39 Unspecified mood [affective] disorder: Secondary | ICD-10-CM | POA: Insufficient documentation

## 2017-06-13 NOTE — Progress Notes (Signed)
BP 119/73   Pulse 66   Temp 97.7 F (36.5 C) (Oral)   Ht 5\' 1"  (1.549 m)   Wt 234 lb (106.1 kg)   BMI 44.21 kg/m    Subjective:    Patient ID: Ashley Hughes, female    DOB: 07/31/1985, 31 y.o.   MRN: [id number]  HPI: Ashley Sewerlizabeth L Stempel is a 31 y.o. female presenting on 06/12/2017 for Numbness (Bilateral arm)  Patient has had bilateral numbness does not have any severe weakness or dead feeling at night.  She does not have to open up her hands in the morning.  Sometimes depending on how she is sleep she may have more numb in housekeeping and does use her hands a lot.  Some years ago she had an MRI of her head and neck.  She states that there could have been some degenerative changes in her neck and the possibility of extra fluid on her brain.  Any medicine for idiopathic hypertension.  Patient also has mood disorder and anxiety and depression symptoms.  We will start this and plan to recheck her in a month.  Relevant past medical, surgical, family and social history reviewed and updated as indicated. Allergies and medications reviewed and updated.  Past Medical History:  Diagnosis Date  . Anxiety     Past Surgical History:  Procedure Laterality Date  . CESAREAN SECTION  2015  . INNER EAR SURGERY      Review of Systems  Constitutional: Negative.  Negative for activity change, fatigue and fever.  HENT: Negative.   Eyes: Negative.   Respiratory: Negative.  Negative for cough.   Cardiovascular: Negative.  Negative for chest pain.  Gastrointestinal: Negative.  Negative for abdominal pain.  Endocrine: Negative.   Genitourinary: Negative.  Negative for dysuria.  Musculoskeletal: Positive for joint swelling and myalgias.  Skin: Negative.   Neurological: Positive for numbness and headaches. Negative for dizziness and weakness.  Psychiatric/Behavioral: Positive for decreased concentration and dysphoric mood. Negative for sleep disturbance and suicidal ideas. The patient is  nervous/anxious.     Allergies as of 06/12/2017   No Known Allergies     Medication List        Accurate as of 06/12/17 11:59 PM. Always use your most recent med list.          acetaminophen 325 MG tablet Commonly known as:  TYLENOL Take 650 mg by mouth as needed.   ARIPiprazole 2 MG tablet Commonly known as:  ABILIFY Take 1 tablet (2 mg total) by mouth daily.   diclofenac 75 MG EC tablet Commonly known as:  VOLTAREN Take 1 tablet (75 mg total) by mouth 2 (two) times daily. For muscle and  Joint pain   DULoxetine 60 MG capsule Commonly known as:  CYMBALTA Take 1 capsule (60 mg total) by mouth daily.   fluticasone 50 MCG/ACT nasal spray Commonly known as:  FLONASE Place 2 sprays into both nostrils daily.   hydrochlorothiazide 25 MG tablet Commonly known as:  HYDRODIURIL Take 1 tablet (25 mg total) by mouth daily.   ibuprofen 200 MG tablet Commonly known as:  ADVIL,MOTRIN Take 400 mg by mouth as needed.   levonorgestrel 20 MCG/24HR IUD Commonly known as:  MIRENA 1 each by Intrauterine route once.   loratadine 10 MG tablet Commonly known as:  CLARITIN Take 1 tablet (10 mg total) by mouth daily.   traZODone 50 MG tablet Commonly known as:  DESYREL Take 1-2 tablets (50-100 mg total) by mouth at  bedtime as needed for sleep.          Objective:    BP 119/73   Pulse 66   Temp 97.7 F (36.5 C) (Oral)   Ht 5\' 1"  (1.549 m)   Wt 234 lb (106.1 kg)   BMI 44.21 kg/m   No Known Allergies  Physical Exam  Constitutional: She is oriented to person, place, and time. She appears well-developed and well-nourished.  HENT:  Head: Normocephalic and atraumatic.  Right Ear: Tympanic membrane, external ear and ear canal normal.  Left Ear: Tympanic membrane, external ear and ear canal normal.  Nose: Nose normal. No rhinorrhea.  Mouth/Throat: Oropharynx is clear and moist and mucous membranes are normal. No oropharyngeal exudate or posterior oropharyngeal erythema.    Eyes: Conjunctivae and EOM are normal. Pupils are equal, round, and reactive to light.  Neck: Normal range of motion. Neck supple.  Cardiovascular: Normal rate, regular rhythm, normal heart sounds and intact distal pulses.  Pulmonary/Chest: Effort normal and breath sounds normal.  Abdominal: Soft. Bowel sounds are normal.  Musculoskeletal: She exhibits deformity. She exhibits no edema or tenderness.  Neurological: She is alert and oriented to person, place, and time. She has normal reflexes. No cranial nerve deficit. She exhibits normal muscle tone. Coordination normal.  Skin: Skin is warm and dry. No rash noted.  Psychiatric: She has a normal mood and affect. Her behavior is normal. Judgment and thought content normal.    Results for orders placed or performed in visit on 10/24/16  PPD  Result Value Ref Range   TB Skin Test Negative    Induration 0 mm      Assessment & Plan:   1. Bilateral hand pain  2. Numbness and tingling in both hands - Ambulatory referral to Neurology Possible carpal tunnel but unclear set of symptoms  3. Neuropathy - Ambulatory referral to Neurology  4. Mood disorder (HCC)    Current Outpatient Medications:  .  acetaminophen (TYLENOL) 325 MG tablet, Take 650 mg by mouth as needed., Disp: , Rfl:  .  diclofenac (VOLTAREN) 75 MG EC tablet, Take 1 tablet (75 mg total) by mouth 2 (two) times daily. For muscle and  Joint pain, Disp: 60 tablet, Rfl: 5 .  DULoxetine (CYMBALTA) 60 MG capsule, Take 1 capsule (60 mg total) by mouth daily., Disp: 30 capsule, Rfl: 5 .  fluticasone (FLONASE) 50 MCG/ACT nasal spray, Place 2 sprays into both nostrils daily., Disp: 16 g, Rfl: 6 .  ibuprofen (ADVIL,MOTRIN) 200 MG tablet, Take 400 mg by mouth as needed., Disp: , Rfl:  .  levonorgestrel (MIRENA) 20 MCG/24HR IUD, 1 each by Intrauterine route once., Disp: , Rfl:  .  loratadine (CLARITIN) 10 MG tablet, Take 1 tablet (10 mg total) by mouth daily., Disp: 30 tablet, Rfl: 11 .   traZODone (DESYREL) 50 MG tablet, Take 1-2 tablets (50-100 mg total) by mouth at bedtime as needed for sleep., Disp: 60 tablet, Rfl: 6 .  ARIPiprazole (ABILIFY) 2 MG tablet, Take 1 tablet (2 mg total) by mouth daily., Disp: 30 tablet, Rfl: 5 .  hydrochlorothiazide (HYDRODIURIL) 25 MG tablet, Take 1 tablet (25 mg total) by mouth daily., Disp: 90 tablet, Rfl: 3 Continue all other maintenance medications as listed above.  Follow up plan: Return in about 4 weeks (around 07/10/2017) for recheck.  Educational handout given for carpal tunnel  Remus LofflerAngel S. Maddix Kliewer PA-C [facility] [address]  [place], [place] [address] [phone]   06/13/2017, 1:31 PM

## 2017-06-29 ENCOUNTER — Telehealth: Payer: Self-pay | Admitting: Physician Assistant

## 2017-06-29 NOTE — Telephone Encounter (Signed)
Patient aware we do not have samples.

## 2017-07-13 ENCOUNTER — Ambulatory Visit: Payer: BLUE CROSS/BLUE SHIELD | Admitting: Physician Assistant

## 2017-07-18 ENCOUNTER — Encounter: Payer: Self-pay | Admitting: Physician Assistant

## 2017-07-20 ENCOUNTER — Encounter: Payer: Self-pay | Admitting: Family Medicine

## 2017-07-20 ENCOUNTER — Telehealth: Payer: Self-pay | Admitting: Physician Assistant

## 2017-07-20 ENCOUNTER — Ambulatory Visit (INDEPENDENT_AMBULATORY_CARE_PROVIDER_SITE_OTHER): Payer: BLUE CROSS/BLUE SHIELD | Admitting: Family Medicine

## 2017-07-20 VITALS — BP 123/67 | HR 77 | Temp 98.0°F | Ht 61.0 in | Wt 240.4 lb

## 2017-07-20 DIAGNOSIS — R42 Dizziness and giddiness: Secondary | ICD-10-CM | POA: Diagnosis not present

## 2017-07-20 DIAGNOSIS — F39 Unspecified mood [affective] disorder: Secondary | ICD-10-CM

## 2017-07-20 MED ORDER — QUETIAPINE FUMARATE 50 MG PO TABS: 50.0000 mg | ORAL_TABLET | Freq: Every day | ORAL | 0 refills | Status: DC

## 2017-07-20 NOTE — Progress Notes (Signed)
   HPI  Patient presents today here with lightheadedness.  Patient explains that she was seen about 1 month ago and started on Abilify for anxiety.  She was using Cymbalta and still is.  She states it was not completely effective and Abilify was started to help. She was also started on HCTZ. She states that over the last month or so she is felt lightheaded and slightly nauseous at times. She states that she feels like she is floating at times.  She has not taken trazodone in quite some time.  She is not a great sleeper but does have a difficult time waking up in the morning.  She usually does not feel rested  PMH: Smoking status noted ROS: Per HPI  Objective: BP 123/67   Pulse 77   Temp 98 F (36.7 C) (Oral)   Ht 5\' 1"  (1.549 m)   Wt 240 lb 6.4 oz (109 kg)   BMI 45.42 kg/m  Gen: NAD, alert, cooperative with exam HEENT: NCAT CV: RRR, good S1/S2, no murmur Resp: CTABL, no wheezes, non-labored Ext: No edema, warm Neuro: Alert and oriented, No gross deficits  Denies SI  Assessment and plan:  #Lightheadedness Possibly multifactorial, however I think it is most likely due to Abilify Recommended stopping Abilify for 2 weeks and then transitioning to Seroquel if she gets completely back to normal. Follow-up 1 month  #mood Disorder Primarily anxiety Continue Cymbalta Discontinue Abilify, start Seroquel if she has returned back to baseline in about 2 weeks. Discontinue trazodone for sleep (she has not taken this for quite some time)   Meds ordered this encounter  Medications  . QUEtiapine (SEROQUEL) 50 MG tablet    Sig: Take 1 tablet (50 mg total) by mouth at bedtime.    Dispense:  30 tablet    Refill:  0    Murtis SinkSam Jerusalem Wert, MD [facility] 07/20/2017, 3:40 PM

## 2017-07-20 NOTE — Patient Instructions (Signed)
Great to see you!  Stop abilify, and start Seroquel in about 2 weeks.  Asking you not to take either one for about 2 weeks to make sure that you get back to normal feeling.  Do not combine Seroquel and trazodone, I would recommend stopping trazodone.

## 2017-07-20 NOTE — Telephone Encounter (Signed)
She came into the office, will follow up

## 2017-07-20 NOTE — Telephone Encounter (Signed)
Please advise on patient's concerns and sickness.

## 2017-07-24 ENCOUNTER — Telehealth: Payer: Self-pay

## 2017-07-24 ENCOUNTER — Telehealth: Payer: Self-pay | Admitting: Physician Assistant

## 2017-07-24 DIAGNOSIS — F419 Anxiety disorder, unspecified: Secondary | ICD-10-CM

## 2017-07-24 NOTE — Progress Notes (Signed)
[facility] Intake Assessment  

## 2017-07-24 NOTE — Progress Notes (Signed)
[facility] Initial Assessment [facility] Telephone Follow-up  MRN: [id number] NAME: Ashley Hughes Date: 07/24/17 Time of Assessment: 3:54 PM Call number: [phone]  Initial Assessment   Reason for call today: Reason for Contact: Initial Assessment  PHQ-9 Scores:  Dep[facility] 07/24/2017 07/20/2017 06/12/2017 11/29/2016 11/08/2016  Decreased Interest 1 3 0 2 0  Down, Depressed, Hopeless 2 2 0 2 1  PHQ - 2 Score 3 5 0 4 1  Altered sleeping 2 2 - 0 -  Tired, decreased energy 2 2 - 2 -  Change in appetite 2 2 - 0 -  Feeling bad or failure about yourself  2 2 - 1 -  Trouble concentrating 1 2 - 0 -  Moving slowly or fidgety/restless 0 0 - 0 -  Suicidal thoughts 0 0 - 0 -  PHQ-9 Score 12 15 - 7 -   .Marland Kitchen. Current Outpatient Medications on File Prior to Visit  Medication Sig Dispense Refill  . acetaminophen (TYLENOL) 325 MG tablet Take 650 mg by mouth as needed.    . diclofenac (VOLTAREN) 75 MG EC tablet Take 1 tablet (75 mg total) by mouth 2 (two) times daily. For muscle and  Joint pain 60 tablet 5  . DULoxetine (CYMBALTA) 60 MG capsule Take 1 capsule (60 mg total) by mouth daily. 30 capsule 5  . fluticasone (FLONASE) 50 MCG/ACT nasal spray Place 2 sprays into both nostrils daily. 16 g 6  . hydrochlorothiazide (HYDRODIURIL) 25 MG tablet Take 1 tablet (25 mg total) by mouth daily. 90 tablet 3  . ibuprofen (ADVIL,MOTRIN) 200 MG tablet Take 400 mg by mouth as needed.    Marland Kitchen. levonorgestrel (MIRENA) 20 MCG/24HR IUD 1 each by Intrauterine route once.    . loratadine (CLARITIN) 10 MG tablet Take 1 tablet (10 mg total) by mouth daily. 30 tablet 11  . QUEtiapine (SEROQUEL) 50 MG tablet Take 1 tablet (50 mg total) by mouth at bedtime. 30 tablet 0   No current facility-administered medications on file prior to visit.     Stress Current stressors: Current Stressors: Body image, Finances, Work environment Sleep: Sleep: Difficulty staying asleep Appetite: Appetite: Weight gain Coping ability:  Coping ability: Resilient Patient taking medications as prescribed: Patient taking medications as prescribed: Yes  Current medications:  Outpatient Encounter Medications as of 07/24/2017  Medication Sig  . acetaminophen (TYLENOL) 325 MG tablet Take 650 mg by mouth as needed.  . diclofenac (VOLTAREN) 75 MG EC tablet Take 1 tablet (75 mg total) by mouth 2 (two) times daily. For muscle and  Joint pain  . DULoxetine (CYMBALTA) 60 MG capsule Take 1 capsule (60 mg total) by mouth daily.  . fluticasone (FLONASE) 50 MCG/ACT nasal spray Place 2 sprays into both nostrils daily.  . hydrochlorothiazide (HYDRODIURIL) 25 MG tablet Take 1 tablet (25 mg total) by mouth daily.  Marland Kitchen. ibuprofen (ADVIL,MOTRIN) 200 MG tablet Take 400 mg by mouth as needed.  Marland Kitchen. levonorgestrel (MIRENA) 20 MCG/24HR IUD 1 each by Intrauterine route once.  . loratadine (CLARITIN) 10 MG tablet Take 1 tablet (10 mg total) by mouth daily.  . QUEtiapine (SEROQUEL) 50 MG tablet Take 1 tablet (50 mg total) by mouth at bedtime.   No facility-administered encounter medications on file as of 07/24/2017.      Self-harm Behaviors Risk Assessment Self-harm risk factors:   Patient endorses recent thoughts of harming self: Have you recently had any thoughts about harming yourself?: No  [place] Suicide Severity Rating Scale: No flowsheet  data found. No flowsheet data found.   Danger to Others Risk Assessment Danger to others risk factors: Danger to Others Risk Factors: No risk factors noted Patient endorses recent thoughts of harming others: Notification required: No need or identified person  Dynamic Appraisal of Situational Aggression (DASA): No flowsheet data found.   Substance Use Assessment Patient recently consumed alcohol:    Alcohol Use Disorder Identification Test (AUDIT): No flowsheet data found. Patient recently used drugs:    Opioid Risk Assessment:    Goals, Interventions and Follow-up Plan Goals: Decrease depression   Interventions: [facility] Follow-up Plan: 2 week follow up phone call   Summary:   Ashley Hughes is a 32 year old female that reports increased anxiety since returning to work.  Ashley Hughes was a stay at home since the birth of her child three years ago.  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   Ashley Hughes reports that she would rather be a stay at home mother instead of working.  Ashley Hughes reports that she and her husband would like to purchase a home therefore she has to work.    Ashley Hughes reports that she has been taking medication for her anxiety since she was been in her 67's.  Ashley Hughes reports that she has been compliant with taking her medication.  Ashley Hughes denies any side effects with taking any of her medication.  Ashley Hughes reports that she has a hard time staying asleep at times.  Ashley Hughes reports that she eats when she feels anxious.  Ashley Hughes reports that she has gained weight since the birth of her child.  Ashley Hughes reports that she is 5'0 tall and 240 pounds.   Ashley Hughes denies SI/HI/Psychosis/Substance Abuse.  Ashley Hughes reports receiving outpatient therapy in 2011 but has not received any therapy since that point in time.   Ashley Hughes, LCAS-A

## 2017-07-24 NOTE — Telephone Encounter (Signed)
[facility]

## 2017-07-25 ENCOUNTER — Telehealth (HOSPITAL_COMMUNITY): Payer: Self-pay | Admitting: Psychiatry

## 2017-07-25 NOTE — Telephone Encounter (Addendum)
[facility] ([facility]) Psychiatric Consultant Case Review   Summary Ashley Hughes is a 32 y.o. year old female with history of mood disorder, anxiety, hypertension, multiple ear surgeries in the past, shoulder pain. Abilify was discontinued on 1/11 with concern for lightheadedness. Quetiapine 50 mg was added to duloxetine 60 mg. She has weight gain since the birth of her child.   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 acetaminophen (TYLENOL) 325 MG tablet Take 650 mg by mouth as needed.    . diclofenac (VOLTAREN) 75 MG EC tablet Take 1 tablet (75 mg total) by mouth 2 (two) times daily. For muscle and  Joint pain 60 tablet 5  . DULoxetine (CYMBALTA) 60 MG capsule Take 1 capsule (60 mg total) by mouth daily. 30 capsule 5  . fluticasone (FLONASE) 50 MCG/ACT nasal spray Place 2 sprays into both nostrils daily. 16 g 6  . hydrochlorothiazide (HYDRODIURIL) 25 MG tablet Take 1 tablet (25 mg total) by mouth daily. 90 tablet 3  . ibuprofen (ADVIL,MOTRIN) 200 MG tablet Take 400 mg by mouth as needed.    Marland Kitchen. levonorgestrel (MIRENA) 20 MCG/24HR IUD 1 each by Intrauterine route once.    . loratadine (CLARITIN) 10 MG tablet Take 1 tablet (10 mg total) by mouth daily. 30 tablet 11  . QUEtiapine (SEROQUEL) 50 MG tablet Take 1 tablet (50 mg total) by mouth at bedtime. 30 tablet 0   No current facility-administered medications on file prior to visit.      Past psychiatry history Outpatient: history of anxiety since 20's Psychiatry admission: denies Previous suicide attempt: denies Past trials of medication: duloxetine,  trazodone History of violence:   Current measures Depression screen [facility] 07/24/2017 07/20/2017 06/12/2017 11/29/2016 11/08/2016  Decreased Interest 1 3 0 2 0  Down, Depressed, Hopeless 2 2 0 2 1  PHQ - 2 Score 3 5 0 4 1  Altered sleeping 2 2 - 0 -  Tired, decreased energy 2 2 - 2 -  Change in appetite 2 2 - 0 -  Feeling bad or failure about yourself  2 2 - 1 -  Trouble concentrating 1 2 - 0 -  Moving slowly or fidgety/restless 0 0 - 0 -  Suicidal thoughts 0 0 - 0 -  PHQ-9 Score 12 15 - 7 -   GAD 7 : Generalized Anxiety Score 07/24/2017  Nervous, Anxious, on Edge 2  Control/stop worrying 2  Worry too much - different things 1  Trouble relaxing 1  Restless 1  Easily annoyed or irritable 2  Afraid - awful might happen 1  Total GAD 7 Score 10    Goals (patient centered) Decrease depression  Assessment/Provisional Diagnosis Ashley Sewerlizabeth L Wiker is a 32 y.o. year old female with history of mood disorder, anxiety, hypertension, multiple ear surgeries in the past, shoulder pain. Psychosocial stressors including returning to work, Programme researcher, broadcasting/film/videobody image, financial strain.   Would recommend uptitration of duloxetine to target depression and anxiety if the patient has not tried this dose in the past. Would recommend to discontinue quetiapine given its metabolic side effect/weight gain.   Recommendation -  Increase duloxetine 90 mg daily (or 60 mg in AM and 30 mg in PM) -  Discontinue quetiapine  Thank you for your consult. We will continue to follow the patient. Please contact [facility]  for any questions or concerns.   The above treatment considerations and suggestions are based on consultation with the [facility] specialist and/or PCP and a review of information available in the shared registry and the patient's Electronic Health Record (EHR). I have not personally examined the patient. All recommendations should be implemented with consideration of the patient's relevant prior history and current clinical status.  Please feel free to call me with any questions about the care of this patient.

## 2017-08-11 ENCOUNTER — Telehealth: Payer: Self-pay

## 2017-08-11 NOTE — Telephone Encounter (Signed)
[facility] - left message.  

## 2017-08-21 ENCOUNTER — Ambulatory Visit: Payer: BLUE CROSS/BLUE SHIELD | Admitting: Physician Assistant

## 2017-08-23 ENCOUNTER — Telehealth: Payer: Self-pay

## 2017-08-23 NOTE — Telephone Encounter (Signed)
[facility] - left message.  

## 2017-08-28 ENCOUNTER — Ambulatory Visit (INDEPENDENT_AMBULATORY_CARE_PROVIDER_SITE_OTHER): Payer: BLUE CROSS/BLUE SHIELD

## 2017-08-28 ENCOUNTER — Ambulatory Visit (INDEPENDENT_AMBULATORY_CARE_PROVIDER_SITE_OTHER): Payer: BLUE CROSS/BLUE SHIELD | Admitting: Nurse Practitioner

## 2017-08-28 ENCOUNTER — Encounter: Payer: Self-pay | Admitting: Nurse Practitioner

## 2017-08-28 VITALS — BP 122/66 | HR 65 | Temp 98.1°F | Ht 61.0 in | Wt 240.0 lb

## 2017-08-28 DIAGNOSIS — M25561 Pain in right knee: Secondary | ICD-10-CM

## 2017-08-28 MED ORDER — PREDNISONE 10 MG (21) PO TBPK: ORAL_TABLET | ORAL | 0 refills | Status: DC

## 2017-08-28 NOTE — Progress Notes (Signed)
   Subjective:    Patient ID: Ashley Hughes, female    DOB: 03/20/1986, 32 y.o.   MRN: [id number]  HPI Patient come sin c/o of right knee pain. Has been hurting intermittent for a few months. Started really bothering her over the weekend and she had to stay out of work Sunday and today. Describes pain as ahching at times and stabbing at times. Rates pain 1/10 currently but can go as high as 9/10. Walking and standing increases pain. Sitting relieves the pain. She has not been taking anything for it. She has been taking diclofenac for her carpal tunnel and it has not helped her knee at all. She denies injury.    Review of Systems  Constitutional: Negative.   Cardiovascular: Negative.   Musculoskeletal: Positive for arthralgias (right knee).  Neurological: Negative.   Psychiatric/Behavioral: Negative.   All other systems reviewed and are negative.      Objective:   Physical Exam  Constitutional: She is oriented to person, place, and time. She appears well-developed and well-nourished. No distress.  Cardiovascular: Normal rate.  Pulmonary/Chest: Effort normal.  Musculoskeletal:  FROM of right knee with pain on full flexion Slight patella tenderness on palpation Mild joint effusion Crepitus on flexion and extension All ligaments intact  Neurological: She is alert and oriented to person, place, and time.  Skin: Skin is warm.  Psychiatric: She has a normal mood and affect. Her behavior is normal.   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 Acute pain of right knee Hold diclofenac while on steroids Rest Elastic wrap when working  Ice bid elevate when sitting If no better after taking steroids will do ortho referral  - DG Knee 1-2 Views Right; Future - predniSONE (STERAPRED UNI-PAK 21 TAB) 10 MG (21) TBPK tablet; As  directed x 6 days  Dispense: 21 tablet; Refill: 0   Ashley Daphine DeutscherMartin, FNP

## 2017-08-28 NOTE — Patient Instructions (Signed)

## 2017-08-29 ENCOUNTER — Ambulatory Visit (INDEPENDENT_AMBULATORY_CARE_PROVIDER_SITE_OTHER): Payer: BLUE CROSS/BLUE SHIELD | Admitting: Physician Assistant

## 2017-08-29 ENCOUNTER — Encounter: Payer: Self-pay | Admitting: Physician Assistant

## 2017-08-29 VITALS — BP 100/63 | HR 74 | Temp 97.0°F | Ht 61.0 in | Wt 242.0 lb

## 2017-08-29 DIAGNOSIS — F39 Unspecified mood [affective] disorder: Secondary | ICD-10-CM

## 2017-08-29 DIAGNOSIS — R5382 Chronic fatigue, unspecified: Secondary | ICD-10-CM | POA: Diagnosis not present

## 2017-08-29 DIAGNOSIS — R0683 Snoring: Secondary | ICD-10-CM | POA: Diagnosis not present

## 2017-08-29 MED ORDER — QUETIAPINE FUMARATE 100 MG PO TABS: 100.0000 mg | ORAL_TABLET | Freq: Every day | ORAL | 2 refills | Status: DC

## 2017-08-29 NOTE — Patient Instructions (Signed)
In a few days you may receive a survey in the mail or online from [employer] regarding your visit with us today. Please take a moment to fill this out. Your feedback is very important to our whole office. It can help us better understand your needs as well as improve your experience and satisfaction. Thank you for taking your time to complete it. We care about you.  Neysa Arts, PA-C  

## 2017-08-31 NOTE — Progress Notes (Signed)
BP 100/63   Pulse 74   Temp (!) 97 F (36.1 C) (Oral)   Ht 5\' 1"  (1.549 m)   Wt 242 lb (109.8 kg)   BMI 45.73 kg/m    Subjective:    Patient ID: Ashley Hughes, female    DOB: Nov 21, 1985, 32 y.o.   MRN: [id number]  HPI: Ashley Hughes is a 32 y.o. female presenting on 08/29/2017 for Follow-up (1 month )  Patient states that the medication is not made much difference.  She has been taking it for a month now.  She still has significant anxiety.  She is extremely tired most of the time.  She states that her husband reports that she snores a lot.  She does not report any witnessed apneas.  She does talk in her sleep a lot.  She states that she does not wake up rested at all.  There was a change in the Abilify to Seroquel last month that she can tell some difference. Depression screen [facility] 2/9 08/29/2017 07/24/2017 07/20/2017 06/12/2017 11/29/2016  Decreased Interest 1 1 3  0 2  Down, Depressed, Hopeless 2 2 2  0 2  PHQ - 2 Score 3 3 5  0 4  Altered sleeping 2 2 2  - 0  Tired, decreased energy 2 2 2  - 2  Change in appetite 2 2 2  - 0  Feeling bad or failure about yourself  2 2 2  - 1  Trouble concentrating 1 1 2  - 0  Moving slowly or fidgety/restless 0 0 0 - 0  Suicidal thoughts 0 0 0 - 0  PHQ-9 Score 12 12 15  - 7    Past Medical History:  Diagnosis Date  . Anxiety    Relevant past medical, surgical, family and social history reviewed and updated as indicated. Interim medical history since our last visit reviewed. Allergies and medications reviewed and updated. DATA REVIEWED: CHART IN EPIC  Family History reviewed for pertinent findings.  Review of Systems  Constitutional: Positive for fatigue. Negative for activity change and fever.  HENT: Negative.   Eyes: Negative.   Respiratory: Negative.  Negative for cough.   Cardiovascular: Negative.  Negative for chest pain.  Gastrointestinal: Negative.  Negative for abdominal pain.  Endocrine: Negative.   Genitourinary: Negative.   Negative for dysuria.  Musculoskeletal: Negative.   Skin: Negative.   Neurological: Negative.   Psychiatric/Behavioral: Positive for decreased concentration and dysphoric mood. The patient is nervous/anxious.     Allergies as of 08/29/2017   No Known Allergies     Medication List        Accurate as of 08/29/17 11:59 PM. Always use your most recent med list.          acetaminophen 325 MG tablet Commonly known as:  TYLENOL Take 650 mg by mouth as needed.   diclofenac 75 MG EC tablet Commonly known as:  VOLTAREN Take 1 tablet (75 mg total) by mouth 2 (two) times daily. For muscle and  Joint pain   DULoxetine 60 MG capsule Commonly known as:  CYMBALTA Take 1 capsule (60 mg total) by mouth daily.   fluticasone 50 MCG/ACT nasal spray Commonly known as:  FLONASE Place 2 sprays into both nostrils daily.   hydrochlorothiazide 25 MG tablet Commonly known as:  HYDRODIURIL Take 1 tablet (25 mg total) by mouth daily.   ibuprofen 200 MG tablet Commonly known as:  ADVIL,MOTRIN Take 400 mg by mouth as needed.   levonorgestrel 20 MCG/24HR IUD Commonly known as:  MIRENA 1 each by Intrauterine route once.   loratadine 10 MG tablet Commonly known as:  CLARITIN Take 1 tablet (10 mg total) by mouth daily.   predniSONE 10 MG (21) Tbpk tablet Commonly known as:  STERAPRED UNI-PAK 21 TAB As directed x 6 days   QUEtiapine 100 MG tablet Commonly known as:  SEROQUEL Take 1 tablet (100 mg total) by mouth at bedtime.          Objective:    BP 100/63   Pulse 74   Temp (!) 97 F (36.1 C) (Oral)   Ht 5\' 1"  (1.549 m)   Wt 242 lb (109.8 kg)   BMI 45.73 kg/m   No Known Allergies  Wt Readings from Last 3 Encounters:  08/29/17 242 lb (109.8 kg)  08/28/17 240 lb (108.9 kg)  07/20/17 240 lb 6.4 oz (109 kg)    Physical Exam  Constitutional: She is oriented to person, place, and time. She appears well-developed and well-nourished.  HENT:  Head: Normocephalic and atraumatic.    Eyes: Conjunctivae and EOM are normal. Pupils are equal, round, and reactive to light.  Cardiovascular: Normal rate, regular rhythm, normal heart sounds and intact distal pulses.  Pulmonary/Chest: Effort normal and breath sounds normal.  Abdominal: Soft. Bowel sounds are normal.  Neurological: She is alert and oriented to person, place, and time. She has normal reflexes.  Skin: Skin is warm and dry. No rash noted.  Psychiatric: She has a normal mood and affect. Her behavior is normal. Judgment and thought content normal.        Assessment & Plan:   1. Chronic fatigue - Ambulatory referral to Sleep Studies  2. Snoring - Ambulatory referral to Sleep Studies  3. Mood disorder (HCC) - QUEtiapine (SEROQUEL) 100 MG tablet; Take 1 tablet (100 mg total) by mouth at bedtime.  Dispense: 30 tablet; Refill: 2   Continue all other maintenance medications as listed above.  Follow up plan: Return in about 4 weeks (around 09/26/2017) for recheck.  Educational handout given for survey   Remus LofflerAngel S. Lien Lyman PA-C [facility] [address]  [place], [place] [id number] [phone]   08/31/2017, 1:48 PM

## 2017-09-18 ENCOUNTER — Ambulatory Visit (INDEPENDENT_AMBULATORY_CARE_PROVIDER_SITE_OTHER): Payer: BLUE CROSS/BLUE SHIELD | Admitting: Family

## 2017-09-18 ENCOUNTER — Encounter: Payer: Self-pay | Admitting: Family

## 2017-09-18 VITALS — BP 134/84 | HR 71 | Temp 97.7°F | Ht 61.0 in | Wt 241.0 lb

## 2017-09-18 DIAGNOSIS — H6121 Impacted cerumen, right ear: Secondary | ICD-10-CM | POA: Diagnosis not present

## 2017-09-18 DIAGNOSIS — H9311 Tinnitus, right ear: Secondary | ICD-10-CM | POA: Diagnosis not present

## 2017-09-18 DIAGNOSIS — H66001 Acute suppurative otitis media without spontaneous rupture of ear drum, right ear: Secondary | ICD-10-CM

## 2017-09-18 MED ORDER — AMOXICILLIN 500 MG PO CAPS: 500.0000 mg | ORAL_CAPSULE | Freq: Two times a day (BID) | ORAL | 0 refills | Status: DC

## 2017-09-18 NOTE — Patient Instructions (Signed)
Tinnitus Tinnitus refers to hearing a sound when there is no actual source for that sound. This is often described as ringing in the ears. However, people with this condition may hear a variety of noises. A person may hear the sound in one ear or in both ears. The sounds of tinnitus can be soft, loud, or somewhere in between. Tinnitus can last for a few seconds or can be constant for days. It may go away without treatment and come back at various times. When tinnitus is constant or happens often, it can lead to other problems, such as trouble sleeping and trouble concentrating. Almost everyone experiences tinnitus at some point. Tinnitus that is long-lasting (chronic) or comes back often is a problem that may require medical attention. What are the causes? The cause of tinnitus is often not known. In some cases, it can result from other problems or conditions, including:  Exposure to loud noises from machinery, music, or other sources.  Hearing loss.  Ear or sinus infections.  Earwax buildup.  A foreign object in the ear.  Use of certain medicines.  Use of alcohol and caffeine.  High blood pressure.  Heart diseases.  Anemia.  Allergies.  Meniere disease.  Thyroid problems.  Tumors.  An enlarged part of a weakened blood vessel (aneurysm).  What are the signs or symptoms? The main symptom of tinnitus is hearing a sound when there is no source for that sound. It may sound like:  Buzzing.  Roaring.  Ringing.  Blowing air, similar to the sound heard when you listen to a seashell.  Hissing.  Whistling.  Sizzling.  Humming.  Running water.  A sustained musical note.  How is this diagnosed? Tinnitus is diagnosed based on your symptoms. Your health care provider will do a physical exam. A comprehensive hearing exam (audiologic exam) will be done if your tinnitus:  Affects only one ear (unilateral).  Causes hearing difficulties.  Lasts 6 months or  longer.  You may also need to see a health care provider who specializes in hearing disorders (audiologist). You may be asked to complete a questionnaire to determine the severity of your tinnitus. Tests may be done to help determine the cause and to rule out other conditions. These can include:  Imaging studies of your head and brain, such as: ? A CT scan. ? An MRI.  An imaging study of your blood vessels (angiogram).  How is this treated? Treating an underlying medical condition can sometimes make tinnitus go away. If your tinnitus continues, other treatments may include:  Medicines, such as certain antidepressants or sleeping aids.  Sound generators to mask the tinnitus. These include: ? Tabletop sound machines that play relaxing sounds to help you fall asleep. ? Wearable devices that fit in your ear and play sounds or music. ? A small device that uses headphones to deliver a signal embedded in music (acoustic neural stimulation). In time, this may change the pathways of your brain and make you less sensitive to tinnitus. This device is used for very severe cases when no other treatment is working.  Therapy and counseling to help you manage the stress of living with tinnitus.  Using hearing aids or cochlear implants, if your tinnitus is related to hearing loss.  Follow these instructions at home:  When possible, avoid being in loud places and being exposed to loud sounds.  Wear hearing protection, such as earplugs, when you are exposed to loud noises.  Do not take stimulants, such as nicotine,   alcohol, or caffeine.  Practice techniques for reducing stress, such as meditation, yoga, or deep breathing.  Use a white noise machine, a humidifier, or other devices to mask the sound of tinnitus.  Sleep with your head slightly raised. This may reduce the impact of tinnitus.  Try to get plenty of rest each night. Contact a health care provider if:  You have tinnitus in just one  ear.  Your tinnitus continues for 3 weeks or longer without stopping.  Home care measures are not helping.  You have tinnitus after a head injury.  You have tinnitus along with any of the following: ? Dizziness. ? Loss of balance. ? Nausea and vomiting. This information is not intended to replace advice given to you by your health care provider. Make sure you discuss any questions you have with your health care provider. Document Released: 06/26/2005 Document Revised: 02/27/2016 Document Reviewed: 11/26/2013 Elsevier Interactive Patient Education  2018 Elsevier Inc.  

## 2017-09-18 NOTE — Progress Notes (Signed)
   Subjective:    Patient ID: Ashley Hughes, female    DOB: 03/20/1986, 32 y.o.   MRN: [id number]  Pt presents to the office today with dizziness that comes and goes over the last week, but has started hearing a "whoosing" sound over the last few days. States nothing helps stop the noise.  Dizziness  This is a new problem. The current episode started 1 to 4 weeks ago. The problem occurs intermittently. The problem has been waxing and waning. Pertinent negatives include no change in bowel habit, chest pain, chills, congestion, coughing, joint swelling, myalgias, rash, urinary symptoms or vomiting. She has tried nothing for the symptoms. The treatment provided no relief.      Review of Systems  Constitutional: Negative for chills.  HENT: Positive for tinnitus. Negative for congestion, ear discharge and ear pain.   Respiratory: Negative for cough.   Cardiovascular: Negative for chest pain.  Gastrointestinal: Negative for change in bowel habit and vomiting.  Musculoskeletal: Negative for joint swelling and myalgias.  Skin: Negative for rash.  Neurological: Positive for dizziness.  All other systems reviewed and are negative.      Objective:   Physical Exam  Constitutional: She is oriented to person, place, and time. She appears well-developed and well-nourished. No distress.  HENT:  Head: Normocephalic and atraumatic.  Left Ear: Tympanic membrane is bulging.  Nose: Nose normal.  Mouth/Throat: Oropharynx is clear and moist.  Right ear cerumen impaction, able to remove large amount of wax but still could not fully visualize TM. I could see mildly erythemas  Eyes: Pupils are equal, round, and reactive to light.  Neck: Normal range of motion. Neck supple. No thyromegaly present.  Cardiovascular: Normal rate, regular rhythm, normal heart sounds and intact distal pulses.  No murmur heard. Pulmonary/Chest: Effort normal and breath sounds normal. No respiratory distress. She has no  wheezes.  Abdominal: Soft. Bowel sounds are normal. She exhibits no distension. There is no tenderness.  Musculoskeletal: Normal range of motion. She exhibits no edema or tenderness.  Neurological: She is alert and oriented to person, place, and time.  Skin: Skin is warm and dry.  Psychiatric: She has a normal mood and affect. Her behavior is normal. Judgment and thought content normal.  Vitals reviewed.  Right ear washed with warm water and peroxide.   BP 134/84   Pulse 71   Temp 97.7 F (36.5 C) (Oral)   Ht 5\' 1"  (1.549 m)   Wt 241 lb (109.3 kg)   BMI 45.54 kg/m       Assessment & Plan:  1. Impacted cerumen of right ear Use OTC ear drops- could not remove all of her wax  2. Tinnitus of right ear If not improved will do referral to ENT  3. Acute suppurative otitis media of right ear without spontaneous rupture of tympanic membrane, recurrence not specified Tylenol prn  - amoxicillin (AMOXIL) 500 MG capsule; Take 1 capsule (500 mg total) by mouth 2 (two) times daily.  Dispense: 14 capsule; Refill: 0     Jannifer Rodneyhristy Hawks, FNP

## 2017-09-26 ENCOUNTER — Other Ambulatory Visit: Payer: Self-pay | Admitting: Physician Assistant

## 2017-09-28 ENCOUNTER — Encounter: Payer: Self-pay | Admitting: Family

## 2017-09-28 ENCOUNTER — Ambulatory Visit (INDEPENDENT_AMBULATORY_CARE_PROVIDER_SITE_OTHER): Payer: BLUE CROSS/BLUE SHIELD | Admitting: Family

## 2017-09-28 VITALS — BP 119/68 | HR 70 | Temp 98.2°F | Ht 61.0 in | Wt 245.6 lb

## 2017-09-28 DIAGNOSIS — F39 Unspecified mood [affective] disorder: Secondary | ICD-10-CM | POA: Diagnosis not present

## 2017-09-28 DIAGNOSIS — F419 Anxiety disorder, unspecified: Secondary | ICD-10-CM | POA: Diagnosis not present

## 2017-09-28 DIAGNOSIS — R5383 Other fatigue: Secondary | ICD-10-CM | POA: Diagnosis not present

## 2017-09-28 MED ORDER — QUETIAPINE FUMARATE 100 MG PO TABS: ORAL_TABLET | ORAL | 2 refills | Status: DC

## 2017-09-28 NOTE — Patient Instructions (Signed)

## 2017-09-28 NOTE — Progress Notes (Signed)
   Subjective:    Patient ID: Ashley Hughes, female    DOB: Jun 11, 1986, 32 y.o.   MRN: [id number]  Anxiety  Presents for initial visit. Onset was 6 to 12 months ago. The problem has been waxing and waning. Symptoms include excessive worry, irritability, nervous/anxious behavior, palpitations, panic and restlessness. Patient reports no depressed mood. Symptoms occur occasionally. The severity of symptoms is moderate. The symptoms are aggravated by work stress. The quality of sleep is good. Nighttime awakenings: none.   Her past medical history is significant for anxiety/panic attacks.      Review of Systems  Constitutional: Positive for irritability.  Cardiovascular: Positive for palpitations.  Psychiatric/Behavioral: The patient is nervous/anxious.   All other systems reviewed and are negative.      Objective:   Physical Exam  Constitutional: She is oriented to person, place, and time. She appears well-developed and well-nourished. No distress.  HENT:  Head: Normocephalic and atraumatic.  Right Ear: External ear normal.  Left Ear: External ear normal.  Nose: Nose normal.  Mouth/Throat: Oropharynx is clear and moist.  Eyes: Pupils are equal, round, and reactive to light.  Neck: Normal range of motion. Neck supple. No thyromegaly present.  Cardiovascular: Normal rate, regular rhythm, normal heart sounds and intact distal pulses.  No murmur heard. Pulmonary/Chest: Effort normal and breath sounds normal. No respiratory distress. She has no wheezes.  Abdominal: Soft. Bowel sounds are normal. She exhibits no distension. There is no tenderness.  Musculoskeletal: Normal range of motion. She exhibits no edema or tenderness.  Neurological: She is alert and oriented to person, place, and time.  Skin: Skin is warm and dry.  Psychiatric: She has a normal mood and affect. Her behavior is normal. Judgment and thought content normal.  Vitals reviewed.     BP 119/68   Pulse 70   Temp  98.2 F (36.8 C) (Oral)   Ht _0  (1.549 m)   Wt 245 lb 9.6 oz (111.4 kg)   BMI 46.41 kg/m      Assessment & Plan:  1. Anxiety - QUEtiapine (SEROQUEL) 100 MG tablet; Take 0.5 tablets (50 mg total) by mouth at bedtime for 7 days, THEN 1 tablet (100 mg total) at bedtime.  Dispense: 90 tablet; Refill: 2 - Ambulatory referral to Psychiatry  2. Fatigue, unspecified type - Anemia Profile B - CMP14+EGFR - TSH  3. Mood disorder (HCC) - QUEtiapine (SEROQUEL) 100 MG tablet; Take 0.5 tablets (50 mg total) by mouth at bedtime for 7 days, THEN 1 tablet (100 mg total) at bedtime.  Dispense: 90 tablet; Refill: 2 - Ambulatory referral to Psychiatry  Labs pending Will restart Seroquel, that PCP had ordered for her. States she never picked up rx Stress management discussed Referral to [place]  Follow up with PCP in 6 weeks  Evelina Dun, FNP

## 2017-09-29 LAB — ANEMIA PROFILE B
Basophils Absolute: 0 10*3/uL (ref 0.0–0.2)
Basos: 0 %
EOS (ABSOLUTE): 0.2 10*3/uL (ref 0.0–0.4)
Eos: 1 %
Ferritin: 60 ng/mL (ref 15–150)
Folate: 10.1 ng/mL (ref >3.0)
Hematocrit: 39.2 % (ref 34.0–46.6)
Hemoglobin: 13.4 g/dL (ref 11.1–15.9)
Immature Grans (Abs): 0.1 10*3/uL (ref 0.0–0.1)
Immature Granulocytes: 1 %
Iron Saturation: 15 % (ref 15–55)
Iron: 55 ug/dL (ref 27–159)
Lymphocytes Absolute: 4.3 10*3/uL — ABNORMAL HIGH (ref 0.7–3.1)
Lymphs: 39 %
MCH: 31.1 pg (ref 26.6–33.0)
MCHC: 34.2 g/dL (ref 31.5–35.7)
MCV: 91 fL (ref 79–97)
Monocytes Absolute: 0.4 10*3/uL (ref 0.1–0.9)
Monocytes: 4 %
Neutrophils Absolute: 6 10*3/uL (ref 1.4–7.0)
Neutrophils: 55 %
Platelets: 275 10*3/uL (ref 150–379)
RBC: 4.31 x10E6/uL (ref 3.77–5.28)
RDW: 13.9 % (ref 12.3–15.4)
Retic Ct Pct: 2.4 % (ref 0.6–2.6)
Total Iron Binding Capacity: 356 ug/dL (ref 250–450)
UIBC: 301 ug/dL (ref 131–425)
Vitamin B-12: 471 pg/mL (ref 232–1245)
WBC: 11 10*3/uL — ABNORMAL HIGH (ref 3.4–10.8)

## 2017-09-29 LAB — CMP14+EGFR
ALT: 16 IU/L (ref 0–32)
AST: 13 IU/L (ref 0–40)
Albumin/Globulin Ratio: 1.8 (ref 1.2–2.2)
Albumin: 4.1 g/dL (ref 3.5–5.5)
Alkaline Phosphatase: 63 IU/L (ref 39–117)
BUN/Creatinine Ratio: 15 (ref 9–23)
BUN: 8 mg/dL (ref 6–20)
Bilirubin Total: 0.2 mg/dL (ref 0.0–1.2)
CO2: 26 mmol/L (ref 20–29)
Calcium: 9.4 mg/dL (ref 8.7–10.2)
Chloride: 100 mmol/L (ref 96–106)
Creatinine, Ser: 0.54 mg/dL — ABNORMAL LOW (ref 0.57–1.00)
GFR calc Af Amer: 143 mL/min/{1.73_m2} (ref >59)
GFR calc non Af Amer: 124 mL/min/{1.73_m2} (ref >59)
Globulin, Total: 2.3 g/dL (ref 1.5–4.5)
Glucose: 87 mg/dL (ref 65–99)
Potassium: 4 mmol/L (ref 3.5–5.2)
Sodium: 141 mmol/L (ref 134–144)
Total Protein: 6.4 g/dL (ref 6.0–8.5)

## 2017-09-29 LAB — TSH: TSH: 4 u[IU]/mL (ref 0.450–4.500)

## 2017-10-16 ENCOUNTER — Ambulatory Visit (INDEPENDENT_AMBULATORY_CARE_PROVIDER_SITE_OTHER): Payer: BLUE CROSS/BLUE SHIELD | Admitting: Family Medicine

## 2017-10-16 ENCOUNTER — Encounter: Payer: Self-pay | Admitting: Family Medicine

## 2017-10-16 VITALS — BP 121/69 | HR 76 | Temp 97.3°F | Ht 61.0 in | Wt 243.8 lb

## 2017-10-16 DIAGNOSIS — H6121 Impacted cerumen, right ear: Secondary | ICD-10-CM | POA: Diagnosis not present

## 2017-10-16 DIAGNOSIS — W57XXXA Bitten or stung by nonvenomous insect and other nonvenomous arthropods, initial encounter: Secondary | ICD-10-CM

## 2017-10-16 MED ORDER — FLUCONAZOLE 150 MG PO TABS: ORAL_TABLET | ORAL | 0 refills | Status: DC

## 2017-10-16 MED ORDER — AMOXICILLIN 500 MG PO CAPS: 500.0000 mg | ORAL_CAPSULE | Freq: Three times a day (TID) | ORAL | 0 refills | Status: DC

## 2017-10-16 NOTE — Patient Instructions (Signed)
Great to see you!   

## 2017-10-16 NOTE — Progress Notes (Signed)
   HPI  Patient presents today here with an insect bite as well as persistent right your symptoms.  Patient states that she was stung by a bee on her right hand 3 days ago.  She had swelling itching and redness as expected, however it seems to be getting worse. Patient denies any movement of the redness or warmth of the arm. She does not have an allergy to bees that she knows of.  Patient also complains of a swishing sound in her right ear, she states that she has been using Debrox drops at home without much improvement.  She is tried cleaning it out aggressively. She has had an irrigation that did not completely remove her cerumen in our practice  PMH: Smoking status noted ROS: Per HPI  Objective: BP 121/69   Pulse 76   Temp (!) 97.3 F (36.3 C) (Oral)   Ht 5\' 1"  (1.549 m)   Wt 243 lb 12.8 oz (110.6 kg)   BMI 46.07 kg/m  Gen: NAD, alert, cooperative with exam HEENT: NCAT, right TM obscured by cerumen, tenderness with attempted curettage, left TM within normal limits CV: RRR, good S1/S2, no murmur Resp: CTABL, no wheezes, non-labored Abd: SNTND, BS present, no guarding or organomegaly Ext: No edema, warm Neuro: Alert and oriented, No gross deficits Skin Right hand with erythema and swelling between the thumb and the second finger on the dorsal surface, patient also with warmth, no erythema spreading beyond the hand,   patient does have bilateral symmetric erythema of the arms which is normal for her, also some excoriation she states are from her  Assessment and plan:  #Arthropod bite Likely only usual irritation and inflammation after bee sting Reassurance provided Amoxicillin given for below  #Impacted cerumen Patient with unusual swishing sound, also with cerumen impaction unable to remove on 2 different attempts Refer to ENT Treat with amoxicillin in case there is underlying infection.   Orders Placed This Encounter  Procedures  . Ambulatory referral to ENT   Referral Priority:   Routine    Referral Type:   Consultation    Referral Reason:   Specialty Services Required    Referred to Provider:   Newman Pieseoh, Su, MD    Requested Specialty:   Otolaryngology    Number of Visits Requested:   1    Meds ordered this encounter  Medications  . amoxicillin (AMOXIL) 500 MG capsule    Sig: Take 1 capsule (500 mg total) by mouth 3 (three) times daily.    Dispense:  30 capsule    Refill:  0  . fluconazole (DIFLUCAN) 150 MG tablet    Sig: Take one pill and repeat in 3 days    Dispense:  2 tablet    Refill:  0    Murtis SinkSam Bradshaw, MD [facility] 10/16/2017, 10:54 AM

## 2018-01-30 ENCOUNTER — Other Ambulatory Visit: Payer: Self-pay | Admitting: Physician Assistant

## 2018-01-31 NOTE — Telephone Encounter (Signed)
Last seen 10/16/17

## 2018-02-06 ENCOUNTER — Encounter: Payer: Self-pay | Admitting: Family

## 2018-02-06 ENCOUNTER — Ambulatory Visit (INDEPENDENT_AMBULATORY_CARE_PROVIDER_SITE_OTHER): Payer: BLUE CROSS/BLUE SHIELD | Admitting: Family

## 2018-02-06 VITALS — BP 137/84 | HR 71 | Temp 99.2°F | Ht 61.0 in | Wt 246.0 lb

## 2018-02-06 DIAGNOSIS — F410 Panic disorder [episodic paroxysmal anxiety] without agoraphobia: Secondary | ICD-10-CM

## 2018-02-06 DIAGNOSIS — F411 Generalized anxiety disorder: Secondary | ICD-10-CM | POA: Diagnosis not present

## 2018-02-06 NOTE — Patient Instructions (Signed)

## 2018-02-06 NOTE — Progress Notes (Signed)
   Subjective:    Patient ID: Ashley Hughes, female    DOB: 09/04/1985, 32 y.o.   MRN: [id number]  Chief Complaint  Patient presents with  . Anxiety   Pt states she is taking the Cymbalta 60 mg daily. She states she has not taken Seroquel in over two weeks.  Anxiety  Presents for follow-up visit. Symptoms include decreased concentration, depressed mood, excessive worry, irritability, nervous/anxious behavior, palpitations, panic and restlessness. Symptoms occur constantly. The severity of symptoms is moderate.    Depression         This is a chronic problem.  The current episode started more than 1 year ago.   The onset quality is gradual.   The problem occurs intermittently.  The problem has been waxing and waning since onset.  Associated symptoms include decreased concentration, helplessness, hopelessness, irritable, restlessness and sad.  Past treatments include SNRIs - Serotonin and norepinephrine reuptake inhibitors.  Past medical history includes anxiety.       Review of Systems  Constitutional: Positive for irritability.  Cardiovascular: Positive for palpitations.  Psychiatric/Behavioral: Positive for decreased concentration and depression. The patient is nervous/anxious.   All other systems reviewed and are negative.      Objective:   Physical Exam  Constitutional: She is oriented to person, place, and time. She appears well-developed and well-nourished. She is irritable. No distress.  HENT:  Head: Normocephalic and atraumatic.  Right Ear: External ear normal.  Left Ear: External ear normal.  Mouth/Throat: Oropharynx is clear and moist.  Eyes: Pupils are equal, round, and reactive to light.  Neck: Normal range of motion. Neck supple. No thyromegaly present.  Cardiovascular: Normal rate, regular rhythm, normal heart sounds and intact distal pulses.  No murmur heard. Pulmonary/Chest: Effort normal and breath sounds normal. No respiratory distress. She has no wheezes.    Abdominal: Soft. Bowel sounds are normal. She exhibits no distension. There is no tenderness.  Musculoskeletal: Normal range of motion. She exhibits no edema or tenderness.  Neurological: She is alert and oriented to person, place, and time. She has normal reflexes. No cranial nerve deficit.  Skin: Skin is warm and dry.  Psychiatric: Judgment and thought content normal. Her mood appears anxious. She is hyperactive.  Vitals reviewed.     BP 137/84   Pulse 71   Temp 99.2 F (37.3 C) (Oral)   Ht 5\' 1"  (1.549 m)   Wt 246 lb (111.6 kg)   BMI 46.48 kg/m      Assessment & Plan:  Ashley Hughes comes in today with chief complaint of Anxiety   Diagnosis and orders addressed:  1. GAD (generalized anxiety disorder) - Ambulatory referral to Psychiatry  2. Panic attack - Ambulatory referral to Psychiatry   Continue Cymbalta  Pt will restart Seroquel  Referral to psychiatry  RTO in 6 weeks or if symptoms worsen or do not improve   Jannifer Rodneyhristy Philippe Gang, FNP

## 2018-02-12 ENCOUNTER — Ambulatory Visit: Payer: BLUE CROSS/BLUE SHIELD | Admitting: Physician Assistant

## 2018-05-31 ENCOUNTER — Telehealth: Payer: Self-pay | Admitting: Physician Assistant

## 2018-05-31 ENCOUNTER — Other Ambulatory Visit: Payer: Self-pay | Admitting: Physician Assistant

## 2018-05-31 MED ORDER — DULOXETINE HCL 60 MG PO CPEP: 60.0000 mg | ORAL_CAPSULE | Freq: Every day | ORAL | 5 refills | Status: DC

## 2018-05-31 NOTE — Telephone Encounter (Signed)
[facility] [place]

## 2018-05-31 NOTE — Telephone Encounter (Signed)
It is generic, so no samples. The cost may the lowest at a local pharmacy and may be on the [facility] $4 list.  Should I send it somewhere else?

## 2018-05-31 NOTE — Telephone Encounter (Signed)
Aware medication has been sent  

## 2018-05-31 NOTE — Telephone Encounter (Signed)
Patient aware no samples. Patient states she is self pay and would like her Cymbalta refilled. Patient was last seen 02-06-2018. Advised patient NTBS but states she is self pay and wanted to know if Ashley Hughes would just send in a refill- please advise

## 2018-05-31 NOTE — Telephone Encounter (Signed)
sent 

## 2019-02-11 ENCOUNTER — Encounter: Payer: Self-pay | Admitting: Physician Assistant

## 2019-02-11 ENCOUNTER — Ambulatory Visit (INDEPENDENT_AMBULATORY_CARE_PROVIDER_SITE_OTHER): Payer: BLUE CROSS/BLUE SHIELD | Admitting: Physician Assistant

## 2019-02-11 DIAGNOSIS — F411 Generalized anxiety disorder: Secondary | ICD-10-CM

## 2019-02-11 MED ORDER — DULOXETINE HCL 60 MG PO CPEP: 60.0000 mg | ORAL_CAPSULE | Freq: Every day | ORAL | 11 refills | Status: DC

## 2019-02-11 NOTE — Progress Notes (Signed)
    Telephone visit  Subjective: PF:[id number] meds PCP: Terald Sleeper, PA-C [id number] Ashley Hughes is a 33 y.o. female calls for telephone consult today. Patient provides verbal consent for consult held via phone.  Patient is identified with 2 separate identifiers.  At this time the entire area is on COVID-19 social distancing and stay home orders are in place.  Patient is of higher risk and therefore we are performing this by a virtual method.  Location of patient: home Location of provider: HOME Others present for call: no  This patient is having a recheck on her chronic medications.  They do include Cymbalta for her anxiety and for her.  She has been doing fairly well over the past year.  We will send a refill in for this medication.  She states the only other thing that she is using she states that she is going to be getting her Mirena removed and then they will talk about using some other birth control.  She states that overall she is has been doing very well.   ROS: Per HPI  No Known Allergies Past Medical History:  Diagnosis Date  . Anxiety     Current Outpatient Medications:  .  diclofenac (VOLTAREN) 75 MG EC tablet, TAKE 1 TABLET BY MOUTH TWICE DAILY FOR MUSCLE AND JOINT PAIN, Disp: , Rfl: 5 .  DULoxetine (CYMBALTA) 60 MG capsule, Take 1 capsule (60 mg total) by mouth daily., Disp: 30 capsule, Rfl: 11 .  levonorgestrel (MIRENA) 20 MCG/24HR IUD, 1 each by Intrauterine route once., Disp: , Rfl:   Assessment/ Plan: 33 y.o. female   1. GAD (generalized anxiety disorder) - DULoxetine (CYMBALTA) 60 MG capsule; Take 1 capsule (60 mg total) by mouth daily.  Dispense: 30 capsule; Refill: 11   No follow-ups on file.  Continue all other maintenance medications as listed above.  Start time: 10: 44 AM End time: 10:44 AM  Meds ordered this encounter  Medications  . DULoxetine (CYMBALTA) 60 MG capsule    Sig: Take 1 capsule (60 mg total) by mouth daily.    Dispense:  30  capsule    Refill:  11    Order Specific Question:   Supervising Provider    Answer:   Janora Norlander [[id number]]    Particia Nearing PA-C [place] [phone]

## 2019-02-12 ENCOUNTER — Encounter: Payer: Self-pay | Admitting: Adult Health

## 2019-02-28 ENCOUNTER — Other Ambulatory Visit: Payer: Self-pay | Admitting: Adult Health

## 2019-03-04 ENCOUNTER — Ambulatory Visit: Payer: BLUE CROSS/BLUE SHIELD | Admitting: Physician Assistant

## 2019-03-13 DIAGNOSIS — Z3169 Encounter for other general counseling and advice on procreation: Secondary | ICD-10-CM | POA: Diagnosis not present

## 2019-03-13 DIAGNOSIS — R8290 Unspecified abnormal findings in urine: Secondary | ICD-10-CM | POA: Diagnosis not present

## 2019-03-13 DIAGNOSIS — Z30432 Encounter for removal of intrauterine contraceptive device: Secondary | ICD-10-CM | POA: Diagnosis not present

## 2019-03-13 DIAGNOSIS — Z01419 Encounter for gynecological examination (general) (routine) without abnormal findings: Secondary | ICD-10-CM | POA: Diagnosis not present

## 2019-03-13 DIAGNOSIS — Z6841 Body Mass Index (BMI) 40.0 and over, adult: Secondary | ICD-10-CM | POA: Diagnosis not present

## 2019-07-30 ENCOUNTER — Ambulatory Visit (INDEPENDENT_AMBULATORY_CARE_PROVIDER_SITE_OTHER): Payer: BLUE CROSS/BLUE SHIELD | Admitting: Family Medicine

## 2019-07-30 ENCOUNTER — Encounter: Payer: Self-pay | Admitting: Family Medicine

## 2019-07-30 DIAGNOSIS — H60332 Swimmer's ear, left ear: Secondary | ICD-10-CM

## 2019-07-30 MED ORDER — NEOMYCIN-POLYMYXIN-HC 3.5-10000-1 OT SOLN: 3.0000 [drp] | Freq: Four times a day (QID) | OTIC | 0 refills | Status: AC

## 2019-07-30 NOTE — Progress Notes (Signed)
Virtual Visit via telephone Note  I connected with Ashley Hughes on 07/30/19 at 1520 by telephone and verified that I am speaking with the correct person using two identifiers. Ashley Hughes is currently located at home and no other people are currently with her during visit. The provider, Elige Radon Rainie Crenshaw, MD is located in their office at time of visit.  Call ended at 1526  I discussed the limitations, risks, security and privacy concerns of performing an evaluation and management service by telephone and the availability of in person appointments. I also discussed with the patient that there may be a patient responsible charge related to this service. The patient expressed understanding and agreed to proceed.   History and Present Illness: Patient is calling in for ear pain on her left ear and she is having drainage and odor and is not getting better with sudafed.  She has an odor from that ear as well.  She tried ear drops debrox and is not helping. She has this problem previously.   1. Acute swimmer's ear of left side     Outpatient Encounter Medications as of 07/30/2019  Medication Sig  . diclofenac (VOLTAREN) 75 MG EC tablet TAKE 1 TABLET BY MOUTH TWICE DAILY FOR MUSCLE AND JOINT PAIN  . neomycin-polymyxin-hydrocortisone (CORTISPORIN) OTIC solution Place 3 drops into the left ear 4 (four) times daily for 7 days.  . [DISCONTINUED] DULoxetine (CYMBALTA) 60 MG capsule Take 1 capsule (60 mg total) by mouth daily.  . [DISCONTINUED] levonorgestrel (MIRENA) 20 MCG/24HR IUD 1 each by Intrauterine route once.   No facility-administered encounter medications on file as of 07/30/2019.    Review of Systems  Constitutional: Negative for chills and fever.  HENT: Positive for ear discharge and ear pain. Negative for congestion, sinus pressure, sneezing, sore throat and tinnitus.   Eyes: Negative for redness and visual disturbance.  Respiratory: Negative for cough, chest tightness  and shortness of breath.   Cardiovascular: Negative for chest pain and leg swelling.  All other systems reviewed and are negative.   Observations/Objective: Patient sounds comfortable and in no acute distress  Assessment and Plan: Problem List Items Addressed This Visit    None    Visit Diagnoses    Acute swimmer's ear of left side    -  Primary   Relevant Medications   neomycin-polymyxin-hydrocortisone (CORTISPORIN) OTIC solution    Based on patient's description it sounds like she has otitis externa and will treat as such, will give patient antibiotic drops and if not improved she will call back  Follow up plan: Return if symptoms worsen or fail to improve.     I discussed the assessment and treatment plan with the patient. The patient was provided an opportunity to ask questions and all were answered. The patient agreed with the plan and demonstrated an understanding of the instructions.   The patient was advised to call back or seek an in-person evaluation if the symptoms worsen or if the condition fails to improve as anticipated.  The above assessment and management plan was discussed with the patient. The patient verbalized understanding of and has agreed to the management plan. Patient is aware to call the clinic if symptoms persist or worsen. Patient is aware when to return to the clinic for a follow-up visit. Patient educated on when it is appropriate to go to the emergency department.    I provided 6 minutes of non-face-to-face time during this encounter.    Elige Radon Aidon Klemens,  MD

## 2019-09-16 ENCOUNTER — Other Ambulatory Visit: Payer: Self-pay

## 2019-09-16 ENCOUNTER — Ambulatory Visit: Payer: Medicaid Other | Attending: Internal Medicine

## 2019-09-16 DIAGNOSIS — Z20822 Contact with and (suspected) exposure to covid-19: Secondary | ICD-10-CM

## 2019-09-17 LAB — NOVEL CORONAVIRUS, NAA: SARS-CoV-2, NAA: NOT DETECTED

## 2019-10-21 DIAGNOSIS — N912 Amenorrhea, unspecified: Secondary | ICD-10-CM | POA: Diagnosis not present

## 2019-10-21 DIAGNOSIS — N760 Acute vaginitis: Secondary | ICD-10-CM | POA: Diagnosis not present

## 2019-10-21 DIAGNOSIS — R3915 Urgency of urination: Secondary | ICD-10-CM | POA: Diagnosis not present

## 2019-11-04 DIAGNOSIS — Z3201 Encounter for pregnancy test, result positive: Secondary | ICD-10-CM | POA: Diagnosis not present

## 2019-11-04 DIAGNOSIS — O3680X0 Pregnancy with inconclusive fetal viability, not applicable or unspecified: Secondary | ICD-10-CM | POA: Diagnosis not present

## 2019-11-26 DIAGNOSIS — Z124 Encounter for screening for malignant neoplasm of cervix: Secondary | ICD-10-CM | POA: Diagnosis not present

## 2019-11-26 DIAGNOSIS — E669 Obesity, unspecified: Secondary | ICD-10-CM | POA: Diagnosis not present

## 2019-11-26 DIAGNOSIS — Z1151 Encounter for screening for human papillomavirus (HPV): Secondary | ICD-10-CM | POA: Diagnosis not present

## 2019-11-26 DIAGNOSIS — Z3480 Encounter for supervision of other normal pregnancy, unspecified trimester: Secondary | ICD-10-CM | POA: Diagnosis not present

## 2019-11-26 DIAGNOSIS — O09529 Supervision of elderly multigravida, unspecified trimester: Secondary | ICD-10-CM | POA: Diagnosis not present

## 2019-11-28 DIAGNOSIS — Z23 Encounter for immunization: Secondary | ICD-10-CM | POA: Diagnosis not present

## 2019-12-26 DIAGNOSIS — Z23 Encounter for immunization: Secondary | ICD-10-CM | POA: Diagnosis not present

## 2020-01-13 DIAGNOSIS — Z6841 Body Mass Index (BMI) 40.0 and over, adult: Secondary | ICD-10-CM | POA: Diagnosis not present

## 2020-01-13 DIAGNOSIS — R300 Dysuria: Secondary | ICD-10-CM | POA: Diagnosis not present

## 2020-01-13 DIAGNOSIS — O0992 Supervision of high risk pregnancy, unspecified, second trimester: Secondary | ICD-10-CM | POA: Diagnosis not present

## 2020-01-13 DIAGNOSIS — O09521 Supervision of elderly multigravida, first trimester: Secondary | ICD-10-CM | POA: Diagnosis not present

## 2020-01-13 DIAGNOSIS — N898 Other specified noninflammatory disorders of vagina: Secondary | ICD-10-CM | POA: Diagnosis not present

## 2020-01-19 IMAGING — DX DG KNEE 1-2V*R*
2 series · 2 of 2 positions shown · non-contrast
Comparison: None.

CLINICAL DATA: Acute right knee pain.  No known injury.

EXAM:
RIGHT KNEE - 1-2 VIEW

[knee ap]
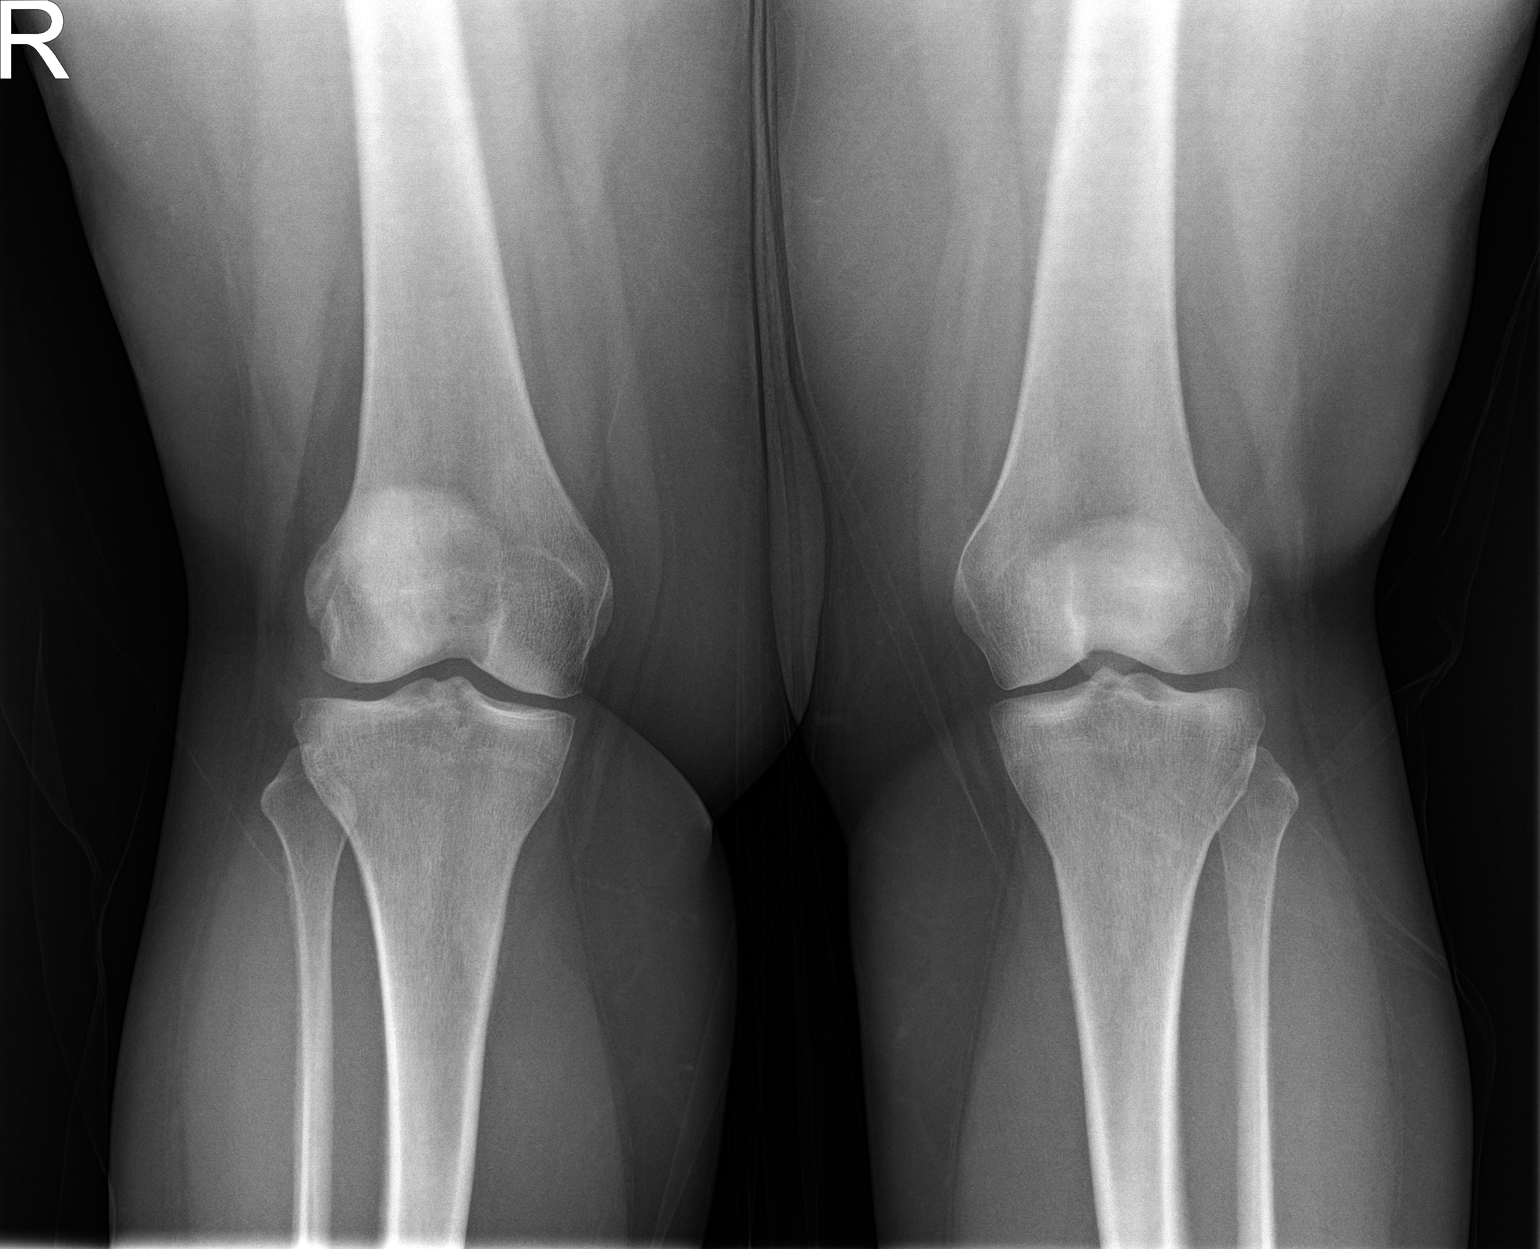

[knee lat]
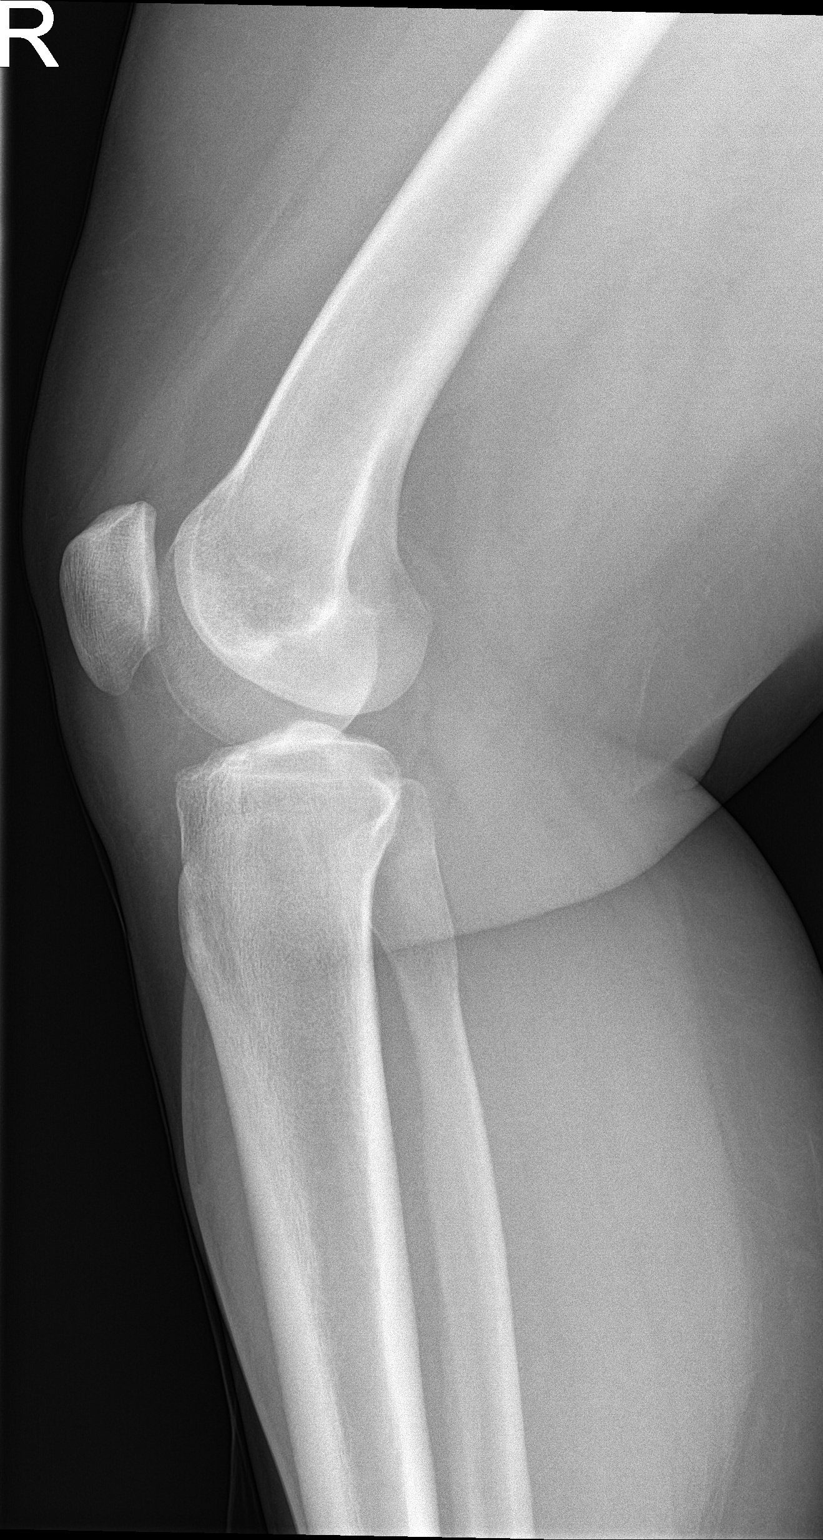

[2 of 2 positions shown; findings below may reference images not displayed]

FINDINGS: No fracture or dislocation. The alignment and joint spaces are
maintained. Small peripheral osteophytes in the lateral tibiofemoral
compartment. Small joint effusion. No focal bone lesion or bony
destructive change.
IMPRESSION: 1. Small joint effusion.
2. Tiny lateral compartment osteophytes suggesting early
osteoarthritis.

## 2020-02-09 DIAGNOSIS — Z6841 Body Mass Index (BMI) 40.0 and over, adult: Secondary | ICD-10-CM | POA: Diagnosis not present

## 2020-02-09 DIAGNOSIS — O0992 Supervision of high risk pregnancy, unspecified, second trimester: Secondary | ICD-10-CM | POA: Diagnosis not present

## 2020-02-17 DIAGNOSIS — Z3480 Encounter for supervision of other normal pregnancy, unspecified trimester: Secondary | ICD-10-CM | POA: Diagnosis not present

## 2020-03-01 DIAGNOSIS — Z6841 Body Mass Index (BMI) 40.0 and over, adult: Secondary | ICD-10-CM | POA: Diagnosis not present

## 2020-03-01 DIAGNOSIS — O0992 Supervision of high risk pregnancy, unspecified, second trimester: Secondary | ICD-10-CM | POA: Diagnosis not present

## 2020-03-09 ENCOUNTER — Other Ambulatory Visit: Payer: Self-pay | Admitting: *Deleted

## 2020-03-09 DIAGNOSIS — F411 Generalized anxiety disorder: Secondary | ICD-10-CM

## 2020-03-17 DIAGNOSIS — Z3480 Encounter for supervision of other normal pregnancy, unspecified trimester: Secondary | ICD-10-CM | POA: Diagnosis not present

## 2020-03-17 DIAGNOSIS — Z23 Encounter for immunization: Secondary | ICD-10-CM | POA: Diagnosis not present

## 2020-03-18 ENCOUNTER — Ambulatory Visit: Payer: BLUE CROSS/BLUE SHIELD | Admitting: Family Medicine

## 2020-03-22 ENCOUNTER — Other Ambulatory Visit: Payer: Self-pay | Admitting: *Deleted

## 2020-03-22 DIAGNOSIS — F411 Generalized anxiety disorder: Secondary | ICD-10-CM

## 2020-04-06 DIAGNOSIS — O0993 Supervision of high risk pregnancy, unspecified, third trimester: Secondary | ICD-10-CM | POA: Diagnosis not present

## 2020-04-06 DIAGNOSIS — Z6841 Body Mass Index (BMI) 40.0 and over, adult: Secondary | ICD-10-CM | POA: Diagnosis not present

## 2020-04-16 DIAGNOSIS — O09521 Supervision of elderly multigravida, first trimester: Secondary | ICD-10-CM | POA: Diagnosis not present

## 2020-04-16 DIAGNOSIS — Z6841 Body Mass Index (BMI) 40.0 and over, adult: Secondary | ICD-10-CM | POA: Diagnosis not present

## 2020-04-16 DIAGNOSIS — O0993 Supervision of high risk pregnancy, unspecified, third trimester: Secondary | ICD-10-CM | POA: Diagnosis not present

## 2020-04-30 DIAGNOSIS — O99213 Obesity complicating pregnancy, third trimester: Secondary | ICD-10-CM | POA: Diagnosis not present

## 2020-04-30 DIAGNOSIS — O09523 Supervision of elderly multigravida, third trimester: Secondary | ICD-10-CM | POA: Diagnosis not present

## 2020-04-30 DIAGNOSIS — O403XX0 Polyhydramnios, third trimester, not applicable or unspecified: Secondary | ICD-10-CM | POA: Diagnosis not present

## 2020-05-07 DIAGNOSIS — O09523 Supervision of elderly multigravida, third trimester: Secondary | ICD-10-CM | POA: Diagnosis not present

## 2020-05-07 DIAGNOSIS — Z3A35 35 weeks gestation of pregnancy: Secondary | ICD-10-CM | POA: Diagnosis not present

## 2020-05-07 DIAGNOSIS — O34219 Maternal care for unspecified type scar from previous cesarean delivery: Secondary | ICD-10-CM | POA: Diagnosis not present

## 2020-05-07 DIAGNOSIS — Z98891 History of uterine scar from previous surgery: Secondary | ICD-10-CM | POA: Diagnosis not present

## 2020-05-11 DIAGNOSIS — Z23 Encounter for immunization: Secondary | ICD-10-CM | POA: Diagnosis not present

## 2020-05-11 DIAGNOSIS — Z3483 Encounter for supervision of other normal pregnancy, third trimester: Secondary | ICD-10-CM | POA: Diagnosis not present

## 2020-05-14 DIAGNOSIS — Z6841 Body Mass Index (BMI) 40.0 and over, adult: Secondary | ICD-10-CM | POA: Diagnosis not present

## 2020-05-14 DIAGNOSIS — O0993 Supervision of high risk pregnancy, unspecified, third trimester: Secondary | ICD-10-CM | POA: Diagnosis not present

## 2020-05-18 DIAGNOSIS — O219 Vomiting of pregnancy, unspecified: Secondary | ICD-10-CM | POA: Diagnosis not present

## 2020-05-21 DIAGNOSIS — O0993 Supervision of high risk pregnancy, unspecified, third trimester: Secondary | ICD-10-CM | POA: Diagnosis not present

## 2020-05-21 DIAGNOSIS — O09521 Supervision of elderly multigravida, first trimester: Secondary | ICD-10-CM | POA: Diagnosis not present

## 2020-05-21 DIAGNOSIS — Z6841 Body Mass Index (BMI) 40.0 and over, adult: Secondary | ICD-10-CM | POA: Diagnosis not present

## 2020-05-28 DIAGNOSIS — Z3480 Encounter for supervision of other normal pregnancy, unspecified trimester: Secondary | ICD-10-CM | POA: Diagnosis not present

## 2020-05-31 DIAGNOSIS — Z7982 Long term (current) use of aspirin: Secondary | ICD-10-CM | POA: Diagnosis not present

## 2020-05-31 DIAGNOSIS — F418 Other specified anxiety disorders: Secondary | ICD-10-CM | POA: Diagnosis not present

## 2020-05-31 DIAGNOSIS — Z79899 Other long term (current) drug therapy: Secondary | ICD-10-CM | POA: Diagnosis not present

## 2020-05-31 DIAGNOSIS — F329 Major depressive disorder, single episode, unspecified: Secondary | ICD-10-CM | POA: Diagnosis not present

## 2020-05-31 DIAGNOSIS — O99824 Streptococcus B carrier state complicating childbirth: Secondary | ICD-10-CM | POA: Diagnosis not present

## 2020-05-31 DIAGNOSIS — O99344 Other mental disorders complicating childbirth: Secondary | ICD-10-CM | POA: Diagnosis not present

## 2020-05-31 DIAGNOSIS — Z3A39 39 weeks gestation of pregnancy: Secondary | ICD-10-CM | POA: Diagnosis not present

## 2020-05-31 DIAGNOSIS — K219 Gastro-esophageal reflux disease without esophagitis: Secondary | ICD-10-CM | POA: Diagnosis not present

## 2020-05-31 DIAGNOSIS — O34211 Maternal care for low transverse scar from previous cesarean delivery: Secondary | ICD-10-CM | POA: Diagnosis not present

## 2020-05-31 DIAGNOSIS — O99214 Obesity complicating childbirth: Secondary | ICD-10-CM | POA: Diagnosis not present

## 2020-05-31 DIAGNOSIS — Z6740 Type O blood, Rh positive: Secondary | ICD-10-CM | POA: Diagnosis not present

## 2020-05-31 DIAGNOSIS — O9962 Diseases of the digestive system complicating childbirth: Secondary | ICD-10-CM | POA: Diagnosis not present

## 2020-05-31 DIAGNOSIS — O09523 Supervision of elderly multigravida, third trimester: Secondary | ICD-10-CM | POA: Diagnosis not present

## 2020-09-08 DIAGNOSIS — Z3009 Encounter for other general counseling and advice on contraception: Secondary | ICD-10-CM | POA: Diagnosis not present

## 2020-09-24 DIAGNOSIS — Z20822 Contact with and (suspected) exposure to covid-19: Secondary | ICD-10-CM | POA: Diagnosis not present

## 2020-09-24 DIAGNOSIS — Z01812 Encounter for preprocedural laboratory examination: Secondary | ICD-10-CM | POA: Diagnosis not present

## 2020-09-27 DIAGNOSIS — F32A Depression, unspecified: Secondary | ICD-10-CM | POA: Diagnosis not present

## 2020-09-27 DIAGNOSIS — Z6841 Body Mass Index (BMI) 40.0 and over, adult: Secondary | ICD-10-CM | POA: Diagnosis not present

## 2020-09-27 DIAGNOSIS — Z98891 History of uterine scar from previous surgery: Secondary | ICD-10-CM | POA: Diagnosis not present

## 2020-09-27 DIAGNOSIS — Z3009 Encounter for other general counseling and advice on contraception: Secondary | ICD-10-CM | POA: Diagnosis not present

## 2020-09-27 DIAGNOSIS — Z79899 Other long term (current) drug therapy: Secondary | ICD-10-CM | POA: Diagnosis not present

## 2020-09-27 DIAGNOSIS — Z302 Encounter for sterilization: Secondary | ICD-10-CM | POA: Diagnosis not present

## 2020-10-12 DIAGNOSIS — Z3009 Encounter for other general counseling and advice on contraception: Secondary | ICD-10-CM | POA: Diagnosis not present

## 2020-10-12 DIAGNOSIS — Z4889 Encounter for other specified surgical aftercare: Secondary | ICD-10-CM | POA: Diagnosis not present

## 2021-12-19 DIAGNOSIS — R7301 Impaired fasting glucose: Secondary | ICD-10-CM | POA: Diagnosis not present

## 2021-12-19 DIAGNOSIS — Z6841 Body Mass Index (BMI) 40.0 and over, adult: Secondary | ICD-10-CM | POA: Diagnosis not present

## 2021-12-19 DIAGNOSIS — M67432 Ganglion, left wrist: Secondary | ICD-10-CM | POA: Diagnosis not present

## 2022-01-11 DIAGNOSIS — M25832 Other specified joint disorders, left wrist: Secondary | ICD-10-CM | POA: Diagnosis not present

## 2022-01-11 DIAGNOSIS — M25532 Pain in left wrist: Secondary | ICD-10-CM | POA: Diagnosis not present

## 2022-01-11 DIAGNOSIS — M1811 Unilateral primary osteoarthritis of first carpometacarpal joint, right hand: Secondary | ICD-10-CM | POA: Diagnosis not present

## 2022-01-11 DIAGNOSIS — M1812 Unilateral primary osteoarthritis of first carpometacarpal joint, left hand: Secondary | ICD-10-CM | POA: Diagnosis not present

## 2022-01-11 DIAGNOSIS — M67431 Ganglion, right wrist: Secondary | ICD-10-CM | POA: Diagnosis not present

## 2022-01-11 DIAGNOSIS — M25531 Pain in right wrist: Secondary | ICD-10-CM | POA: Diagnosis not present

## 2022-02-04 DIAGNOSIS — M25832 Other specified joint disorders, left wrist: Secondary | ICD-10-CM | POA: Diagnosis not present

## 2022-02-27 DIAGNOSIS — M25832 Other specified joint disorders, left wrist: Secondary | ICD-10-CM | POA: Diagnosis not present

## 2022-02-27 DIAGNOSIS — M25532 Pain in left wrist: Secondary | ICD-10-CM | POA: Diagnosis not present

## 2022-03-22 DIAGNOSIS — Z713 Dietary counseling and surveillance: Secondary | ICD-10-CM | POA: Diagnosis not present

## 2022-03-22 DIAGNOSIS — Z23 Encounter for immunization: Secondary | ICD-10-CM | POA: Diagnosis not present

## 2022-03-22 DIAGNOSIS — Z6841 Body Mass Index (BMI) 40.0 and over, adult: Secondary | ICD-10-CM | POA: Diagnosis not present

## 2022-04-10 DIAGNOSIS — M65332 Trigger finger, left middle finger: Secondary | ICD-10-CM | POA: Diagnosis not present

## 2022-04-10 DIAGNOSIS — M67431 Ganglion, right wrist: Secondary | ICD-10-CM | POA: Diagnosis not present

## 2022-04-10 DIAGNOSIS — M65832 Other synovitis and tenosynovitis, left forearm: Secondary | ICD-10-CM | POA: Diagnosis not present
